# Patient Record
Sex: Female | Born: 1940 | Race: White | Hispanic: No | Marital: Married | Smoking: Never smoker
Health system: Southern US, Community
[De-identification: ages and names within clinical notes are randomized; demographics above are authoritative.]

## PROBLEM LIST (undated history)

## (undated) DIAGNOSIS — C801 Malignant (primary) neoplasm, unspecified: Secondary | ICD-10-CM

## (undated) HISTORY — DX: Malignant (primary) neoplasm, unspecified: C80.1

---

## 1978-09-27 HISTORY — PX: TUBAL LIGATION: SHX77

## 2008-11-25 DIAGNOSIS — C801 Malignant (primary) neoplasm, unspecified: Secondary | ICD-10-CM

## 2008-11-25 HISTORY — DX: Malignant (primary) neoplasm, unspecified: C80.1

## 2009-08-04 ENCOUNTER — Ambulatory Visit: Payer: Self-pay | Admitting: Family Medicine

## 2009-08-04 DIAGNOSIS — C50919 Malignant neoplasm of unspecified site of unspecified female breast: Secondary | ICD-10-CM | POA: Insufficient documentation

## 2009-08-05 ENCOUNTER — Encounter: Payer: Self-pay | Admitting: Family Medicine

## 2010-09-25 ENCOUNTER — Ambulatory Visit: Payer: Self-pay | Admitting: Family Medicine

## 2010-09-25 DIAGNOSIS — Z780 Asymptomatic menopausal state: Secondary | ICD-10-CM

## 2010-10-27 NOTE — Letter (Signed)
Summary: Records Dated 07-24-07 thru 01-01-09/[facility]  Records Dated 07-24-07 thru 01-01-09/[facility]   Imported By: Lanelle Bal 10/09/2009 12:42:33  _____________________________________________________________________  External Attachment:    Type:   Image     Comment:   External Document

## 2010-10-29 NOTE — Assessment & Plan Note (Signed)
Summary: CPE   Vital Signs:  Patient profile:   70 year old female Height:      65 inches Weight:      138 pounds BMI:     23.05 O2 Sat:      96 % on Room air Pulse rate:   66 / minute BP sitting:   104 / 57  (left arm) Cuff size:   regular  Vitals Entered By: Payton Spark CMA (September 25, 2010 2:53 PM)  O2 Flow:  Room air CC: CPE    Primary Care Provider:  Seymour Bars DO  CC:  CPE .  History of Present Illness: 70 yo postmenopausal G3P3 WF presents for CPE w/o pap.  She is s/p TAH for non cancerous reasons and denies any vag bleeding or discharge.  She was diagnosed with L breast cancer 2 yrs ago and has DECLINED ANY intervention.  She is very adamant about not proceeding with mammography, surgery, radiation, chemo, etc and understands this risk.  She eats healthy, exercises.  She is due to see the eye doctor.  Denies fam hx of premature heart dz.  Had a normal colonoscopy 3 yrs ago and declined any immunizations today.  Feels great and has no complaints today.  Is due for fasting labs and a DEXA scan.  Current Medications (verified): 1)  None  Allergies (verified): No Known Drug Allergies  Past History:  Past Medical History: Reviewed history from 08/04/2009 and no changes required. L breast cancer 11-2008 (no biopsy, no treatment) G3P3  Past Surgical History: Reviewed history from 08/04/2009 and no changes required. TAH 1980  Family History: Reviewed history from 08/04/2009 and no changes required. mother died  at 55  HTN, CHF, COPD, OA father died ETOH son ETOHism, T2DM  Social History: Finished HS.  Owns cleaning business. Married to [place].  Has 3 sons. Denies smoking or ETOH. Regularly exercises.  Review of Systems  The patient denies anorexia, fever, weight loss, weight gain, vision loss, decreased hearing, hoarseness, chest pain, syncope, dyspnea on exertion, peripheral edema, prolonged cough, headaches, hemoptysis, abdominal pain, melena,  hematochezia, severe indigestion/heartburn, hematuria, incontinence, genital sores, muscle weakness, suspicious skin lesions, transient blindness, difficulty walking, depression, unusual weight change, abnormal bleeding, enlarged lymph nodes, angioedema, breast masses, and testicular masses.    Physical Exam  General:  alert, well-developed, well-nourished, and well-hydrated.   Head:  normocephalic and atraumatic.   Eyes:  pupils equal, pupils round, and pupils reactive to light.   Ears:  EACs patent; TMs translucent and gray with good cone of light and bony landmarks.  Nose:  no nasal discharge.   Mouth:  pharynx pink and moist and fair dentition.   Neck:  no masses.  no audible carotid bruits Breasts:  No mass, nodules, thickening, tenderness, bulging, retraction, inflamation, nipple discharge or skin changes noted on the R.  There is a nickel sized mass at 11 0'clock L breast and a 2nd cystic type mass, at 9 0'clock L breast w/o nipple discharge or axillary LA.  1st mass is tender with poor movement  Lungs:  Normal respiratory effort, chest expands symmetrically. Lungs are clear to auscultation, no crackles or wheezes. Heart:  Normal rate and regular rhythm. S1 and S2 normal without gallop, murmur, click, rub or other extra sounds. Abdomen:  Bowel sounds positive,abdomen soft and non-tender without masses, organomegaly or hernias noted. Msk:  no joint tenderness, no joint swelling, no joint warmth, and no redness over joints.   Pulses:  2+ radial  and pedal pulses Extremities:  no LE edema dystrophic great toenails Skin:  color normal and no suspicious lesions.   Cervical Nodes:  No lymphadenopathy noted Psych:  good eye contact, not anxious appearing, and not depressed appearing.     Impression & Recommendations:  Problem # 1:  HEALTH MAINTENANCE EXAM (ICD-V70.0) Keeping healthy checklist for women reviewed. Update fasting labs. She has declined mammogram or treatment for breast  cancer and understands the risk including mortality associated with this. Agrees to update her DEXA scan and fasting labs. Declined any immunizations. is s/p TAH for non cancerous reasons, so does not need a pap smear. She is free to call me anytime to proceed with eval/ treatment of her left breast cancer. She is not high risk for heart dz, has excellent BP/BMI and eats healthy and exercises. MVI + Calcium with D daily. Colonoscopy due in 7 yrs.  Other Orders: T-DXA Bone Density/ Appendicular (84696) T-Dual DXA Bone Density/ Axial (29528) T-CBC w/Diff ([id number]) T-Comprehensive Metabolic Panel [id number]) T-Lipid Profile [id number]) T-Sed Rate (Automated) [id number]) T-CRP (C-Reactive Protein) [phone])   Orders Added: 1)  T-DXA Bone Density/ Appendicular [77081] 2)  T-Dual DXA Bone Density/ Axial [77080] 3)  T-CBC w/Diff [[id number]] 4)  T-Comprehensive Metabolic Panel [[id number]] 5)  T-Lipid Profile [[id number]] 6)  T-Sed Rate (Automated) [[id number]] 7)  T-CRP (C-Reactive Protein) [23860] 8)  Est. Patient age 69&> [02542]

## 2011-04-29 ENCOUNTER — Encounter: Payer: Self-pay | Admitting: Family Medicine

## 2011-04-30 ENCOUNTER — Encounter: Payer: Self-pay | Admitting: Family Medicine

## 2011-04-30 ENCOUNTER — Ambulatory Visit (INDEPENDENT_AMBULATORY_CARE_PROVIDER_SITE_OTHER): Payer: Medicare PPO | Admitting: Family Medicine

## 2011-04-30 DIAGNOSIS — L0291 Cutaneous abscess, unspecified: Secondary | ICD-10-CM

## 2011-04-30 DIAGNOSIS — L0390 Cellulitis, unspecified: Secondary | ICD-10-CM

## 2011-04-30 DIAGNOSIS — L237 Allergic contact dermatitis due to plants, except food: Secondary | ICD-10-CM

## 2011-04-30 DIAGNOSIS — L255 Unspecified contact dermatitis due to plants, except food: Secondary | ICD-10-CM

## 2011-04-30 MED ORDER — SULFAMETHOXAZOLE-TRIMETHOPRIM 800-160 MG PO TABS: 1.0000 | ORAL_TABLET | Freq: Two times a day (BID) | ORAL | Status: AC

## 2011-04-30 MED ORDER — TRIAMCINOLONE ACETONIDE 0.5 % EX OINT: TOPICAL_OINTMENT | Freq: Every day | CUTANEOUS | Status: AC

## 2011-04-30 NOTE — Progress Notes (Signed)
  Subjective:    Patient ID: Crystal Ward, female    DOB: 09-Jun-1941, 70 y.o.   MRN: [id number]  HPI About a week ago noticed an itchy spot on the right shin.  Next day really read then noticed a scab in the center. Says her husband "operated on it" and  Got some pus out of it.  Says itstil really itches.  Has been using triple antibiotic oint on it. Note she has a few scattered lesions of poison ivey on her legs and forearms.    Review of Systems     Objective:   Physical Exam  Skin:       RT shin with a 4 x 6 cm erythematous and edematous lesion with fine erytehmatous papules around the border  There is a scab in teh center. No active drianage or pus,         Assessment & Plan:  I think this is a combination of cellullitis from a wound and poison ivey - will treat with oral ABX and topical steroid cream. F.U. If not better in one week.

## 2011-04-30 NOTE — Patient Instructions (Signed)
Call if not not getting better.

## 2012-05-09 ENCOUNTER — Ambulatory Visit (INDEPENDENT_AMBULATORY_CARE_PROVIDER_SITE_OTHER): Payer: Medicare PPO | Admitting: Family Medicine

## 2012-05-09 ENCOUNTER — Encounter: Payer: Self-pay | Admitting: Family Medicine

## 2012-05-09 ENCOUNTER — Other Ambulatory Visit: Payer: Self-pay | Admitting: Family Medicine

## 2012-05-09 VITALS — BP 118/70 | HR 56 | Temp 97.5°F | Ht 65.5 in | Wt 139.0 lb

## 2012-05-09 DIAGNOSIS — N61 Mastitis without abscess: Secondary | ICD-10-CM

## 2012-05-09 DIAGNOSIS — Z23 Encounter for immunization: Secondary | ICD-10-CM

## 2012-05-09 MED ORDER — SULFAMETHOXAZOLE-TRIMETHOPRIM 800-160 MG PO TABS: 1.0000 | ORAL_TABLET | Freq: Two times a day (BID) | ORAL | Status: AC

## 2012-05-09 NOTE — Addendum Note (Signed)
Addended by: Avon Gully C on: 05/09/2012 12:03 PM   Modules accepted: Orders

## 2012-05-09 NOTE — Progress Notes (Signed)
  Subjective:    Patient ID: Crystal Ward, female    DOB: [birth year], 71 y.o.   MRN: [id number]  HPI Sore on the left breast for about 1.5 weeks. Says no trauma.  Has been using  Had a similar sore on her leg, wasn't MRSA. Was treated with triamcinolone.  It does not itch. It is tender. She has still been able to wear her bra. She denies any active drainage of the lesion.   Review of Systems     Objective:   Physical Exam Area of cellulitis on the right medial breast. Approximately 2 x 3 cm. Does have a more firm center that's approximately 1 cm. No active drainage with compression. I did cut are made a small scab in the center and just wanted to test for MRSA. I was unable to express any pus.       Assessment & Plan:  Cellulitis on the beast.- Will treat with Bactrim. Followup in 2 weeks reexamine the area. It is completely gone resolved at that point time she can cancel the appointment. We will call her with the culture results.  I encouraged her to keep the area clean and dry. Call if there's any drainage. Also recommend warm compresses. Handout given on cellulitis.

## 2012-05-09 NOTE — Addendum Note (Signed)
Addended by: Wyline Beady on: 05/09/2012 04:36 PM   Modules accepted: Orders

## 2012-05-09 NOTE — Patient Instructions (Signed)

## 2012-05-13 LAB — WOUND CULTURE

## 2012-05-26 ENCOUNTER — Ambulatory Visit: Payer: Medicare PPO | Admitting: Family Medicine

## 2014-02-13 ENCOUNTER — Telehealth: Payer: Self-pay | Admitting: Physician Assistant

## 2014-02-13 NOTE — Telephone Encounter (Signed)
Left message for patient to return call. Also stated patient needs colonoscopy, mammogram and office visit.

## 2014-02-13 NOTE — Telephone Encounter (Signed)
Call pt: make pt aware past due for screening mammogram and colonoscopy. We really need a follow up appt since 2013. We can go ahead and place these referral in the meantime.

## 2014-07-15 ENCOUNTER — Ambulatory Visit (INDEPENDENT_AMBULATORY_CARE_PROVIDER_SITE_OTHER): Payer: Medicare PPO | Admitting: Family Medicine

## 2014-07-15 ENCOUNTER — Encounter: Payer: Self-pay | Admitting: Family Medicine

## 2014-07-15 ENCOUNTER — Telehealth: Payer: Self-pay | Admitting: Family Medicine

## 2014-07-15 VITALS — BP 120/64 | HR 74 | Ht 65.5 in | Wt 148.0 lb

## 2014-07-15 DIAGNOSIS — Z23 Encounter for immunization: Secondary | ICD-10-CM

## 2014-07-15 DIAGNOSIS — Z0000 Encounter for general adult medical examination without abnormal findings: Secondary | ICD-10-CM

## 2014-07-15 DIAGNOSIS — N6320 Unspecified lump in the left breast, unspecified quadrant: Secondary | ICD-10-CM

## 2014-07-15 DIAGNOSIS — Z13220 Encounter for screening for lipoid disorders: Secondary | ICD-10-CM

## 2014-07-15 DIAGNOSIS — Z780 Asymptomatic menopausal state: Secondary | ICD-10-CM

## 2014-07-15 DIAGNOSIS — N63 Unspecified lump in breast: Secondary | ICD-10-CM

## 2014-07-15 DIAGNOSIS — R5383 Other fatigue: Secondary | ICD-10-CM

## 2014-07-15 DIAGNOSIS — R0781 Pleurodynia: Secondary | ICD-10-CM

## 2014-07-15 DIAGNOSIS — Z1211 Encounter for screening for malignant neoplasm of colon: Secondary | ICD-10-CM

## 2014-07-15 DIAGNOSIS — N6325 Unspecified lump in the left breast, overlapping quadrants: Secondary | ICD-10-CM | POA: Insufficient documentation

## 2014-07-15 MED ORDER — AMBULATORY NON FORMULARY MEDICATION: Status: DC

## 2014-07-15 NOTE — Progress Notes (Signed)
Subjective:    Crystal Ward is a 73 y.o. female who presents for Medicare Annual/Subsequent preventive examination.  Preventive Screening-Counseling & Management  Tobacco History  Smoking status  . Never Smoker   Smokeless tobacco  . Not on file     Problems Prior to Visit 1.   Current Problems (verified) Patient Active Problem List   Diagnosis Date Noted  . Breast lump on left side at 12 o'clock position 07/15/2014  . Postmenopausal status 09/25/2010  . NEOPLASM, MALIGNANT, LEFT BREAST 08/04/2009    Medications Prior to Visit No current outpatient prescriptions on file prior to visit.   No current facility-administered medications on file prior to visit.    Current Medications (verified) Current Outpatient Prescriptions  Medication Sig Dispense Refill  . AMBULATORY NON FORMULARY MEDICATION Medication Name: Shingles vaccine IM x 1  1 Units  0   No current facility-administered medications for this visit.     Allergies (verified) Review of patient's allergies indicates no known allergies.   PAST HISTORY  Family History Family History  Problem Relation Age of Onset  . Hypertension Mother   . Heart disease Mother     CHFf  . COPD Mother   . Arthritis Mother   . Alcohol abuse Father   . Alcohol abuse Son   . Diabetes Son     type 2    Social History History  Substance Use Topics  . Smoking status: Never Smoker   . Smokeless tobacco: Not on file  . Alcohol Use: No     Are there smokers in your home (other than you)? No  Risk Factors Current exercise habits: cleans houses  Dietary issues discussed: None  Cardiac risk factors: advanced age (older than 30 for men, 16 for women).  Depression Screen (Note: if answer to either of the following is "Yes", a more complete depression screening is indicated)   Over the past two weeks, have you felt down, depressed or hopeless? No  Over the past two weeks, have you felt little interest or pleasure in  doing things? No  Have you lost interest or pleasure in daily life? No  Do you often feel hopeless? No  Do you cry easily over simple problems? No  Activities of Daily Living In your present state of health, do you have any difficulty performing the following activities?:  Driving? No Managing money?  No Feeding yourself? No Getting from bed to chair? No  Climbing a flight of stairs? No Preparing food and eating?: No Bathing or showering? No Getting dressed: No Getting to the toilet? No Using the toilet:No Moving around from place to place: No In the past year have you fallen or had a near fall?:No   Are you sexually active?  No  Do you have more than one partner?  No  Hearing Difficulties: No Do you often ask people to speak up or repeat themselves? Yes Do you experience ringing or noises in your ears? No Do you have difficulty understanding soft or whispered voices? Yes   Do you feel that you have a problem with memory? No  Do you often misplace items? No  Do you feel safe at home?  No  Cognitive Testing  Alert? Yes  Normal Appearance?Yes  Oriented to person? Yes  Place? Yes   Time? Yes  Recall of three objects?  Yes  Can perform simple calculations? Yes  Displays appropriate judgment?Yes  Can read the correct time from a watch face?Yes   6 CIT  score of 2/28 ( normal)    Advanced Directives have been discussed with the patient? Yes  List the Names of Other Physician/Practitioners you currently use: 1.    Indicate any recent Medical Services you may have received from other than [facility] providers in the past year (date may be approximate).  Immunization History  Administered Date(s) Administered  . Pneumococcal Conjugate-13 07/15/2014  . Tdap 05/09/2012    Screening Tests Health Maintenance  Topic Date Due  . Zostavax  11/04/2001  . Pneumococcal Polysaccharide Vaccine Age 83 And Over  11/04/2006  . Influenza Vaccine  07/16/2015 (Originally 04/27/2014)  .  Mammogram  07/16/2019 (Originally 11/05/1991)  . Colonoscopy  07/16/2019 (Originally 11/05/1991)  . Tetanus/tdap  05/09/2022    All answers were reviewed with the patient and necessary referrals were made:  METHENEY,CATHERINE, MD   07/15/2014   History reviewed: allergies, current medications, past family history, past medical history, past social history, past surgical history and problem list  Review of Systems A comprehensive review of systems was negative.    Objective:     Vision by Snellen chart: right eye:20/40, left eye:20/50  Body mass index is 24.25 kg/(m^2). BP 120/64  Pulse 74  Ht 5' 5.5" (1.664 m)  Wt 148 lb (67.132 kg)  BMI 24.25 kg/m2  BP 120/64  Pulse 74  Ht 5' 5.5" (1.664 m)  Wt 148 lb (67.132 kg)  BMI 24.25 kg/m2 General appearance: alert, cooperative and appears stated age Head: Normocephalic, without obvious abnormality, atraumatic Eyes: conj clear EOMI, PEERLA Ears: normal TM's and external ear canals both ears Nose: Nares normal. Septum midline. Mucosa normal. No drainage or sinus tenderness. Throat: lips, mucosa, and tongue normal; teeth and gums normal Neck: no adenopathy, no carotid bruit, no JVD, supple, symmetrical, trachea midline and thyroid not enlarged, symmetric, no tenderness/mass/nodules Back: symmetric, no curvature. ROM normal. No CVA tenderness. Lungs: clear to auscultation bilaterally Breasts: right breast with normal appearance and no palpable lesions. Left breast with normal appearance.Or surface skin changes. No erythema. She does have an approximately 2 x 3 cm oval smooth lesion at the 12:00 position about 1 cm from the edge of the area a lot. Heart: regular rate and rhythm, S1, S2 normal, no murmur, click, rub or gallop Abdomen: soft, non-tender; bowel sounds normal; no masses,  no organomegaly Extremities: extremities normal, atraumatic, no cyanosis or edema Pulses: 2+ and symmetric Skin: Skin color, texture, turgor normal. No  rashes or lesions Lymph nodes: Cervical, supraclavicular, and axillary nodes normal. Neurologic: Alert and oriented X 3, normal strength and tone. Normal symmetric reflexes. Normal coordination and gait     Assessment:     Medicare annual wellness exam.      Plan:     During the course of the visit the patient was educated and counseled about appropriate screening and preventive services including:    Pneumococcal vaccine   - Prevnar 13 givne today  Flu shot declined.  Recommend vision exam w/ optometry  Screenin colonoscopy declined.  Stool cards given today  Rx given for shingles vaccine  Breast lump - patient this has been present for 6 or 7 years and had a workup then. She has refused biopsy and all treatment since then. She still declines to have any type of workup her mammogram or biopsy. Reassuring factors that has not changed in size in that time frame.  She also specifically request to have some mineral deficiency tests done. She is a firm believer and supplements to help  reduce her chance of cancer. She has felt a little bit more tired. She does take a B12 supplement. He is willing to pay out of pocket for this latter not covered with her insurance to  Diet review for nutrition referral? Yes ____  Not Indicated ____   Patient Instructions (the written plan) was given to the patient.  Medicare Attestation I have personally reviewed: The patient's medical and social history Their use of alcohol, tobacco or illicit drugs Their current medications and supplements The patient's functional ability including ADLs,fall risks, home safety risks, cognitive, and hearing and visual impairment Diet and physical activities Evidence for depression or mood disorders  The patient's weight, height, BMI, and visual acuity have been recorded in the chart.  I have made referrals, counseling, and provided education to the patient based on review of the above and I have provided the  patient with a written personalized care plan for preventive services.     METHENEY,CATHERINE, MD   07/15/2014

## 2014-07-15 NOTE — Telephone Encounter (Signed)
Sorry can they send [place] a copy of her last one.

## 2014-07-15 NOTE — Telephone Encounter (Signed)
Larene Beach from [facility] called because they received a referral for colonoscopy, but they state that patient is not due until 2018

## 2014-07-16 LAB — COMPLETE METABOLIC PANEL WITH GFR
ALBUMIN: 4.3 g/dL (ref 3.5–5.2)
ALK PHOS: 46 U/L (ref 39–117)
ALT: 17 U/L (ref 0–35)
AST: 20 U/L (ref 0–37)
BILIRUBIN TOTAL: 0.5 mg/dL (ref 0.2–1.2)
BUN: 17 mg/dL (ref 6–23)
CO2: 27 mEq/L (ref 19–32)
Calcium: 9.5 mg/dL (ref 8.4–10.5)
Chloride: 106 mEq/L (ref 96–112)
Creat: 0.84 mg/dL (ref 0.50–1.10)
GFR, EST NON AFRICAN AMERICAN: 70 mL/min
GFR, Est African American: 80 mL/min
Glucose, Bld: 97 mg/dL (ref 70–99)
POTASSIUM: 4.6 meq/L (ref 3.5–5.3)
Sodium: 140 mEq/L (ref 135–145)
TOTAL PROTEIN: 6.3 g/dL (ref 6.0–8.3)

## 2014-07-16 LAB — CBC
HEMATOCRIT: 38.9 % (ref 36.0–46.0)
Hemoglobin: 13.2 g/dL (ref 12.0–15.0)
MCH: 29.7 pg (ref 26.0–34.0)
MCHC: 33.9 g/dL (ref 30.0–36.0)
MCV: 87.6 fL (ref 78.0–100.0)
Platelets: 195 10*3/uL (ref 150–400)
RBC: 4.44 MIL/uL (ref 3.87–5.11)
RDW: 14.2 % (ref 11.5–15.5)
WBC: 4.6 10*3/uL (ref 4.0–10.5)

## 2014-07-16 LAB — FOLATE: Folate: >20 ng/mL

## 2014-07-16 LAB — LIPID PANEL
CHOL/HDL RATIO: 3 ratio
Cholesterol: 172 mg/dL (ref 0–200)
HDL: 58 mg/dL (ref >39)
LDL Cholesterol: 69 mg/dL (ref 0–99)
Triglycerides: 224 mg/dL — ABNORMAL HIGH (ref <150)
VLDL: 45 mg/dL — ABNORMAL HIGH (ref 0–40)

## 2014-07-16 LAB — TSH: TSH: 1.551 u[IU]/mL (ref 0.350–4.500)

## 2014-07-16 LAB — MAGNESIUM: MAGNESIUM: 1.7 mg/dL (ref 1.5–2.5)

## 2014-07-16 LAB — T3, FREE: T3, Free: 2.8 pg/mL (ref 2.3–4.2)

## 2014-07-16 LAB — T4, FREE: Free T4: 0.85 ng/dL (ref 0.80–1.80)

## 2014-07-16 LAB — VITAMIN B12: Vitamin B-12: 1122 pg/mL — ABNORMAL HIGH (ref 211–911)

## 2014-07-16 LAB — FERRITIN: FERRITIN: 76 ng/mL (ref 10–291)

## 2014-07-18 ENCOUNTER — Ambulatory Visit (INDEPENDENT_AMBULATORY_CARE_PROVIDER_SITE_OTHER): Payer: Medicare PPO

## 2014-07-18 ENCOUNTER — Other Ambulatory Visit: Payer: Self-pay | Admitting: Family Medicine

## 2014-07-18 DIAGNOSIS — R0781 Pleurodynia: Secondary | ICD-10-CM

## 2014-07-18 DIAGNOSIS — Z780 Asymptomatic menopausal state: Secondary | ICD-10-CM

## 2014-07-18 DIAGNOSIS — N6325 Unspecified lump in the left breast, overlapping quadrants: Secondary | ICD-10-CM

## 2014-07-18 DIAGNOSIS — N6320 Unspecified lump in the left breast, unspecified quadrant: Secondary | ICD-10-CM

## 2014-07-18 DIAGNOSIS — M859 Disorder of bone density and structure, unspecified: Secondary | ICD-10-CM

## 2014-07-18 DIAGNOSIS — R5383 Other fatigue: Secondary | ICD-10-CM

## 2014-07-18 LAB — VITAMIN B1: VITAMIN B1 (THIAMINE): 27 nmol/L (ref 8–30)

## 2014-07-18 LAB — VITAMIN B6: VITAMIN B6: 78.8 ng/mL — AB (ref 2.1–21.7)

## 2014-07-19 ENCOUNTER — Encounter: Payer: Self-pay | Admitting: Family Medicine

## 2014-07-19 DIAGNOSIS — M8580 Other specified disorders of bone density and structure, unspecified site: Secondary | ICD-10-CM | POA: Insufficient documentation

## 2014-07-24 ENCOUNTER — Encounter: Payer: Self-pay | Admitting: *Deleted

## 2015-03-17 ENCOUNTER — Ambulatory Visit: Payer: Medicare PPO | Admitting: Family Medicine

## 2015-03-17 ENCOUNTER — Ambulatory Visit: Payer: Self-pay | Admitting: Family Medicine

## 2015-03-21 ENCOUNTER — Ambulatory Visit (INDEPENDENT_AMBULATORY_CARE_PROVIDER_SITE_OTHER): Payer: Medicare PPO | Admitting: Family Medicine

## 2015-03-21 ENCOUNTER — Encounter: Payer: Self-pay | Admitting: Family Medicine

## 2015-03-21 VITALS — BP 100/55 | HR 68 | Ht 65.5 in | Wt 149.0 lb

## 2015-03-21 DIAGNOSIS — E049 Nontoxic goiter, unspecified: Secondary | ICD-10-CM | POA: Diagnosis not present

## 2015-03-21 DIAGNOSIS — R51 Headache: Secondary | ICD-10-CM

## 2015-03-21 DIAGNOSIS — R519 Headache, unspecified: Secondary | ICD-10-CM

## 2015-03-21 DIAGNOSIS — Z79899 Other long term (current) drug therapy: Secondary | ICD-10-CM | POA: Diagnosis not present

## 2015-03-21 LAB — CBC WITH DIFFERENTIAL/PLATELET
Basophils Absolute: 0.1 10*3/uL (ref 0.0–0.1)
Basophils Relative: 1 % (ref 0–1)
Eosinophils Absolute: 0.8 10*3/uL — ABNORMAL HIGH (ref 0.0–0.7)
Eosinophils Relative: 13 % — ABNORMAL HIGH (ref 0–5)
HEMATOCRIT: 37.7 % (ref 36.0–46.0)
HEMOGLOBIN: 12.4 g/dL (ref 12.0–15.0)
LYMPHS ABS: 1.2 10*3/uL (ref 0.7–4.0)
LYMPHS PCT: 20 % (ref 12–46)
MCH: 28.9 pg (ref 26.0–34.0)
MCHC: 32.9 g/dL (ref 30.0–36.0)
MCV: 87.9 fL (ref 78.0–100.0)
MPV: 10.3 fL (ref 8.6–12.4)
Monocytes Absolute: 0.5 10*3/uL (ref 0.1–1.0)
Monocytes Relative: 8 % (ref 3–12)
NEUTROS ABS: 3.5 10*3/uL (ref 1.7–7.7)
Neutrophils Relative %: 58 % (ref 43–77)
Platelets: 200 10*3/uL (ref 150–400)
RBC: 4.29 MIL/uL (ref 3.87–5.11)
RDW: 14.3 % (ref 11.5–15.5)
WBC: 6.1 10*3/uL (ref 4.0–10.5)

## 2015-03-21 NOTE — Progress Notes (Addendum)
Subjective:    Patient ID: Crystal Ward, female    DOB: 1940-11-22, 74 y.o.   MRN: [id number]  HPI 7 days ago started with a pounding servere HA.  Took some ASA and some supplements and then got better on Saturday.  That night at 3AM had a pounding HA that woke up and then vomited 3 times.  Took 800mg  IBU.  Lasted all day Sunday.  Got relief with Goodies powder. Then only had a mild pressure. Not sure if sinuses or allergy.  HA was frontal and then radiated back. Now has a light pressure on the top or right side of head.  Says this HA was 10/10.    Before this has been waking up with the right eye matted and crusty. Watering a lot and crusting some That has been better this week.  No neurologicl changes. No vision changes.  No chane in balance issues. No hx of migraines.    She has noticed her shoulder and neck have been more "tense" lately   Review of Systems  No nausea or diarrhea. No weakness numbness or paresthesias of the extremities. No speech changes. No facial droop. No hearing change.  BP 100/55 mmHg  Pulse 68  Ht 5' 5.5" (1.664 m)  Wt 149 lb (67.586 kg)  BMI 24.41 kg/m2    No Known Allergies  Past Medical History  Diagnosis Date  . Cancer 11-2008    LT breast/no biopsy/no treatment    Past Surgical History  Procedure Laterality Date  . Tubal ligation  1980    History   Social History  . Marital Status: Married    Spouse Name: N/A  . Number of Children: N/A  . Years of Education: N/A   Occupational History  . Not on file.   Social History Main Topics  . Smoking status: Never Smoker   . Smokeless tobacco: Not on file  . Alcohol Use: No  . Drug Use: Not on file  . Sexual Activity: Not on file     Comment: Owns cleaning business, 3 sons, regularly exercises, finished HS.   Other Topics Concern  . Not on file   Social History Narrative    Family History  Problem Relation Age of Onset  . Hypertension Mother   . Heart disease Mother     CHFf  .  COPD Mother   . Arthritis Mother   . Alcohol abuse Father   . Alcohol abuse Son   . Diabetes Son     type 2    Outpatient Encounter Prescriptions as of 03/21/2015  Medication Sig  . [DISCONTINUED] AMBULATORY NON FORMULARY MEDICATION Medication Name: Shingles vaccine IM x 1   No facility-administered encounter medications on file as of 03/21/2015.          Objective:   Physical Exam  Constitutional: She is oriented to person, place, and time. She appears well-developed and well-nourished.  HENT:  Head: Normocephalic and atraumatic.  Right Ear: External ear normal.  Left Ear: External ear normal.  Nose: Nose normal.  Mouth/Throat: Oropharynx is clear and moist. No oropharyngeal exudate.  TMs and canals are clear.   Eyes: Conjunctivae and EOM are normal. Pupils are equal, round, and reactive to light. Right eye exhibits no discharge. Left eye exhibits no discharge. No scleral icterus.  Neck: Neck supple. Thyromegaly present.  Enlarge thyroid, slightly larger on the left.   Cardiovascular: Normal rate, regular rhythm and normal heart sounds.   Pulmonary/Chest: Effort normal and breath sounds  normal. She has no wheezes.  Abdominal: Soft. Bowel sounds are normal. She exhibits no distension and no mass. There is no tenderness. There is no rebound and no guarding.  Musculoskeletal: She exhibits no edema.  Some pain over the cervical spine with extension. Some pain with full rotation right and left.  Tender over both upper trapezius muscles.   Lymphadenopathy:    She has no cervical adenopathy.  Neurological: She is alert and oriented to person, place, and time. She has normal reflexes. She displays normal reflexes. No cranial nerve deficit. She exhibits normal muscle tone. Coordination normal.  Skin: Skin is warm and dry.  Psychiatric: She has a normal mood and affect. Her behavior is normal. Judgment and thought content normal.          Assessment & Plan:  Severe headache-this  is concerning to me in a 74 year old with no prior history of migraines etc. There are no neurologic findings on exam. But I would still recommend moving forward with CT of the head. I would also like to check some blood work including a CBC, thyroid level electrolytes etc. After much discussion she wanted to do the blood work today and if everything comes back normal and she has another headache then she is okay with getting the head CT. We did discuss that there is some radiation exposure with CT. Next  Enlarged thyroid gland noted on exam today. I had not previously noticed this. Recommend TSH and ultrasound for further evaluation.  Right eye conjunctivitis-patient is now symptom free and has no findings on exam today.

## 2015-03-22 LAB — COMPLETE METABOLIC PANEL WITH GFR
ALT: 16 U/L (ref 0–35)
AST: 19 U/L (ref 0–37)
Albumin: 4.2 g/dL (ref 3.5–5.2)
Alkaline Phosphatase: 49 U/L (ref 39–117)
BILIRUBIN TOTAL: 0.4 mg/dL (ref 0.2–1.2)
BUN: 26 mg/dL — ABNORMAL HIGH (ref 6–23)
CO2: 30 mEq/L (ref 19–32)
CREATININE: 1.05 mg/dL (ref 0.50–1.10)
Calcium: 9.8 mg/dL (ref 8.4–10.5)
Chloride: 103 mEq/L (ref 96–112)
GFR, EST NON AFRICAN AMERICAN: 53 mL/min — AB
GFR, Est African American: 61 mL/min
GLUCOSE: 92 mg/dL (ref 70–99)
Potassium: 4.3 mEq/L (ref 3.5–5.3)
Sodium: 139 mEq/L (ref 135–145)
Total Protein: 6.6 g/dL (ref 6.0–8.3)

## 2015-03-22 LAB — C-REACTIVE PROTEIN: CRP: <0.5 mg/dL (ref <0.60)

## 2015-03-22 LAB — SEDIMENTATION RATE: SED RATE: 1 mm/h (ref 0–30)

## 2015-03-22 LAB — TSH: TSH: 1.368 u[IU]/mL (ref 0.350–4.500)

## 2015-03-22 LAB — T4, FREE: Free T4: 1.1 ng/dL (ref 0.80–1.80)

## 2015-03-25 ENCOUNTER — Other Ambulatory Visit: Payer: Self-pay | Admitting: *Deleted

## 2015-03-25 DIAGNOSIS — R519 Headache, unspecified: Secondary | ICD-10-CM

## 2015-03-25 DIAGNOSIS — R51 Headache: Principal | ICD-10-CM

## 2015-07-14 ENCOUNTER — Encounter: Payer: Self-pay | Admitting: Family Medicine

## 2015-07-14 ENCOUNTER — Ambulatory Visit (INDEPENDENT_AMBULATORY_CARE_PROVIDER_SITE_OTHER): Payer: Medicare PPO | Admitting: Family Medicine

## 2015-07-14 VITALS — BP 126/68 | HR 78 | Temp 98.2°F | Resp 18 | Wt 156.6 lb

## 2015-07-14 DIAGNOSIS — J00 Acute nasopharyngitis [common cold]: Secondary | ICD-10-CM

## 2015-07-14 DIAGNOSIS — J209 Acute bronchitis, unspecified: Secondary | ICD-10-CM

## 2015-07-14 MED ORDER — AMOXICILLIN 875 MG PO TABS: 875.0000 mg | ORAL_TABLET | Freq: Two times a day (BID) | ORAL | Status: DC

## 2015-07-14 NOTE — Progress Notes (Signed)
   Subjective:    Patient ID: Crystal Ward, female    DOB: 1941-08-19, 74 y.o.   MRN: [id number]  HPI Cough Pt states has had a cough for 3 wks that's getting worse. Is productive with clear sputum and states the cough is "deep".  No fever, chills.  Lots of post nasal drip. Lots of mucous. No sinus pressure. Taking IBU to sleep at night.    Review of Systems     Objective:   Physical Exam  Constitutional: She is oriented to person, place, and time. She appears well-developed and well-nourished.  HENT:  Head: Normocephalic and atraumatic.  Right Ear: External ear normal.  Left Ear: External ear normal.  Nose: Nose normal.  Mouth/Throat: Oropharynx is clear and moist.  TMs and canals are clear.   Eyes: Conjunctivae and EOM are normal. Pupils are equal, round, and reactive to light.  Neck: Neck supple. No thyromegaly present.  Cardiovascular: Normal rate, regular rhythm and normal heart sounds.   Pulmonary/Chest: Effort normal and breath sounds normal. She has no wheezes.  Lymphadenopathy:    She has no cervical adenopathy.  Neurological: She is alert and oriented to person, place, and time.  Skin: Skin is warm and dry.  Psychiatric: She has a normal mood and affect.          Assessment & Plan:  Acute tracheobronchitis - likely viral. To go ahead and give her perception for an antibiotic to fill if she suddenly gets worse or is not improving by the end of the week. Continue Naprosyn at bedtime since it does seem to help control her cough. Make sure hydrating well. Encouraged her to stay home and rest from work.

## 2015-07-24 ENCOUNTER — Ambulatory Visit (INDEPENDENT_AMBULATORY_CARE_PROVIDER_SITE_OTHER): Payer: Commercial Managed Care - HMO

## 2015-07-24 ENCOUNTER — Ambulatory Visit (INDEPENDENT_AMBULATORY_CARE_PROVIDER_SITE_OTHER): Payer: Commercial Managed Care - HMO | Admitting: Family Medicine

## 2015-07-24 ENCOUNTER — Encounter: Payer: Self-pay | Admitting: Family Medicine

## 2015-07-24 VITALS — BP 125/64 | HR 68 | Temp 98.2°F | Resp 18 | Wt 155.9 lb

## 2015-07-24 DIAGNOSIS — R0989 Other specified symptoms and signs involving the circulatory and respiratory systems: Secondary | ICD-10-CM | POA: Diagnosis not present

## 2015-07-24 DIAGNOSIS — R0981 Nasal congestion: Secondary | ICD-10-CM

## 2015-07-24 DIAGNOSIS — R053 Chronic cough: Secondary | ICD-10-CM

## 2015-07-24 DIAGNOSIS — R05 Cough: Secondary | ICD-10-CM

## 2015-07-24 DIAGNOSIS — R062 Wheezing: Secondary | ICD-10-CM

## 2015-07-24 NOTE — Progress Notes (Signed)
   Subjective:    Patient ID: Crystal Ward, female    DOB: 06-Jul-1941, 74 y.o.   MRN: [id number]  HPI Nasal congestion and cough x 5 days.  No facial pain or perssure.  No fever, chills.  Has been wheezing esp at night.  Ear have been popping.  Says she took the full course of amoxicillin and she is not better. No known exposure to pertussis or TB.  She says her cough is actually better at night when she lays on her right side. It seems to settle down and she is actually sleeping well. The cough gradually gets worse during the day.   Review of Systems     Objective:   Physical Exam  Constitutional: She is oriented to person, place, and time. She appears well-developed and well-nourished.  HENT:  Head: Normocephalic and atraumatic.  Right Ear: External ear normal.  Left Ear: External ear normal.  Nose: Nose normal.  Mouth/Throat: Oropharynx is clear and moist.  TMs and canals are clear.   Eyes: Conjunctivae and EOM are normal. Pupils are equal, round, and reactive to light.  Neck: Neck supple. No thyromegaly present.  Cardiovascular: Normal rate, regular rhythm and normal heart sounds.   Pulmonary/Chest: Effort normal and breath sounds normal. She has no wheezes.  Slight inspiratory wheeze bilaterally  Lymphadenopathy:    She has no cervical adenopathy.  Neurological: She is alert and oriented to person, place, and time.  Skin: Skin is warm and dry.  Psychiatric: She has a normal mood and affect.          Assessment & Plan:  Cough with sinus congestion - I am concerned because she feels like she is actually getting worse. We'll go ahead and get a chest x-ray today she does have some persistent wheezing pulse ox is reassuring. Consider pneumonia versus bronchitis versus chest mass. No crackles or other signs of volume overload to suggest fluid. CXR is normal. Will tx with azithromycing.

## 2015-07-25 LAB — CBC WITH DIFFERENTIAL/PLATELET
BASOS ABS: 0.1 10*3/uL (ref 0.0–0.1)
Basophils Relative: 1 % (ref 0–1)
EOS ABS: 0.2 10*3/uL (ref 0.0–0.7)
EOS PCT: 3 % (ref 0–5)
HEMATOCRIT: 41.1 % (ref 36.0–46.0)
Hemoglobin: 13.7 g/dL (ref 12.0–15.0)
LYMPHS ABS: 1.3 10*3/uL (ref 0.7–4.0)
LYMPHS PCT: 21 % (ref 12–46)
MCH: 29.5 pg (ref 26.0–34.0)
MCHC: 33.3 g/dL (ref 30.0–36.0)
MCV: 88.6 fL (ref 78.0–100.0)
MONO ABS: 0.6 10*3/uL (ref 0.1–1.0)
MPV: 11.3 fL (ref 8.6–12.4)
Monocytes Relative: 10 % (ref 3–12)
Neutro Abs: 4.1 10*3/uL (ref 1.7–7.7)
Neutrophils Relative %: 65 % (ref 43–77)
PLATELETS: 208 10*3/uL (ref 150–400)
RBC: 4.64 MIL/uL (ref 3.87–5.11)
RDW: 13.2 % (ref 11.5–15.5)
WBC: 6.3 10*3/uL (ref 4.0–10.5)

## 2015-07-25 NOTE — Progress Notes (Signed)
Quick Note:  All labs are normal. ______ 

## 2015-07-29 ENCOUNTER — Telehealth: Payer: Self-pay | Admitting: Family Medicine

## 2015-07-29 LAB — BORDETELLA PERTUSSIS PCR

## 2015-07-29 NOTE — Telephone Encounter (Signed)
FYIUbaldo Glassing from [facility] called, they are unable to preform the Bordetella Pertussis PCR test because it has to be a sterile culture. They are able to process the Culture, Bordetella and those results will be uploaded when completed.

## 2015-08-08 LAB — CULTURE, BORDETELLA PERTUSSIS

## 2016-04-02 ENCOUNTER — Encounter: Payer: Self-pay | Admitting: Family Medicine

## 2016-04-02 ENCOUNTER — Ambulatory Visit (INDEPENDENT_AMBULATORY_CARE_PROVIDER_SITE_OTHER): Payer: Commercial Managed Care - HMO | Admitting: Family Medicine

## 2016-04-02 VITALS — BP 134/68 | HR 70 | Wt 154.0 lb

## 2016-04-02 DIAGNOSIS — M8580 Other specified disorders of bone density and structure, unspecified site: Secondary | ICD-10-CM

## 2016-04-02 DIAGNOSIS — N183 Chronic kidney disease, stage 3 unspecified: Secondary | ICD-10-CM

## 2016-04-02 DIAGNOSIS — E559 Vitamin D deficiency, unspecified: Secondary | ICD-10-CM | POA: Diagnosis not present

## 2016-04-02 DIAGNOSIS — R0989 Other specified symptoms and signs involving the circulatory and respiratory systems: Secondary | ICD-10-CM | POA: Diagnosis not present

## 2016-04-02 DIAGNOSIS — E538 Deficiency of other specified B group vitamins: Secondary | ICD-10-CM | POA: Diagnosis not present

## 2016-04-02 DIAGNOSIS — D649 Anemia, unspecified: Secondary | ICD-10-CM | POA: Diagnosis not present

## 2016-04-02 LAB — CBC WITH DIFFERENTIAL/PLATELET
BASOS PCT: 1 %
Basophils Absolute: 58 cells/uL (ref 0–200)
EOS PCT: 2 %
Eosinophils Absolute: 116 cells/uL (ref 15–500)
HCT: 41.3 % (ref 35.0–45.0)
HEMOGLOBIN: 13.2 g/dL (ref 11.7–15.5)
LYMPHS ABS: 1276 {cells}/uL (ref 850–3900)
Lymphocytes Relative: 22 %
MCH: 28.6 pg (ref 27.0–33.0)
MCHC: 32 g/dL (ref 32.0–36.0)
MCV: 89.4 fL (ref 80.0–100.0)
MONOS PCT: 8 %
MPV: 10.2 fL (ref 7.5–12.5)
Monocytes Absolute: 464 cells/uL (ref 200–950)
NEUTROS ABS: 3886 {cells}/uL (ref 1500–7800)
Neutrophils Relative %: 67 %
PLATELETS: 203 10*3/uL (ref 140–400)
RBC: 4.62 MIL/uL (ref 3.80–5.10)
RDW: 14.3 % (ref 11.0–15.0)
WBC: 5.8 10*3/uL (ref 3.8–10.8)

## 2016-04-02 LAB — FERRITIN: FERRITIN: 63 ng/mL (ref 20–288)

## 2016-04-02 LAB — VITAMIN B12: VITAMIN B 12: 1392 pg/mL — AB (ref 200–1100)

## 2016-04-02 NOTE — Progress Notes (Signed)
Subjective:    CC: noticed her pulse on the pulse ox going up and down.   HPI: 75 year old female with no current medical problems, prior history of left breast cancer with no significant treatment except for biopsy, comes in today complaining of starting to use her husband's pulse ox. Says noticed the pulse going up and down. She will occ feel like a "catch" in her breathing.  She says it just lasts for a second. No lightheadedness or dizziness when it happens. She says it's not really a fluttering. Some is like a catching her breath but she can tell it's from her heart. She's not had any chest pain or discomfort. 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 He's been noticing this for the last year. No new medications etc. She is partly concerned because her husband was diagnosed with heart failure about 2 years ago. She has not noticed any extremity swelling.  Past medical history, Surgical history, Family history not pertinant except as noted below, Social history, Allergies, and medications have been entered into the medical record, reviewed, and corrections made.   Review of Systems: No fevers, chills, night sweats, weight loss, chest pain, or shortness of breath.   Objective:    General: Well Developed, well nourished, and in no acute distress.  Neuro: Alert and oriented x3, extra-ocular muscles intact, sensation grossly intact.  HEENT: Normocephalic, atraumatic  Skin: Warm and dry, no rashes. Cardiac: Regular rate and rhythm, no murmurs rubs or gallops, no lower extremity edema.  Respiratory: Clear to auscultation bilaterally. Not using accessory muscles, speaking in full sentences.   Impression and Recommendations:   Abnormal pulse-we'll do EKG today. We'll do some lab work to rule out thyroid disorder and check for anemia etc. Will check for electrolyte disturbance. If the symptoms become more frequent or she starts to develop chest pain and please call back immediately  and we can work her up further with a stress test etc. They've her reassurance. Today her pulse is nice and regular. Blood pressure at goal.  Osteopenia-due to recheck vitamin D level.   EKG today shows rate of 62 bpm, normal sinus rhythm with normal axis. No ST-T wave changes.

## 2016-04-03 LAB — COMPLETE METABOLIC PANEL WITH GFR
ALT: 19 U/L (ref 6–29)
AST: 23 U/L (ref 10–35)
Albumin: 4.4 g/dL (ref 3.6–5.1)
Alkaline Phosphatase: 59 U/L (ref 33–130)
BILIRUBIN TOTAL: 0.5 mg/dL (ref 0.2–1.2)
BUN: 20 mg/dL (ref 7–25)
CO2: 25 mmol/L (ref 20–31)
CREATININE: 0.98 mg/dL — AB (ref 0.60–0.93)
Calcium: 10.2 mg/dL (ref 8.6–10.4)
Chloride: 102 mmol/L (ref 98–110)
GFR, EST NON AFRICAN AMERICAN: 57 mL/min — AB (ref >=60)
GFR, Est African American: 66 mL/min (ref >=60)
GLUCOSE: 91 mg/dL (ref 65–99)
Potassium: 4.3 mmol/L (ref 3.5–5.3)
SODIUM: 139 mmol/L (ref 135–146)
TOTAL PROTEIN: 6.8 g/dL (ref 6.1–8.1)

## 2016-04-03 LAB — VITAMIN D 25 HYDROXY (VIT D DEFICIENCY, FRACTURES): VIT D 25 HYDROXY: 73 ng/mL (ref 30–100)

## 2016-04-03 LAB — TSH: TSH: 1.6 mIU/L

## 2016-04-05 ENCOUNTER — Encounter: Payer: Self-pay | Admitting: Family Medicine

## 2016-04-05 DIAGNOSIS — N183 Chronic kidney disease, stage 3 unspecified: Secondary | ICD-10-CM | POA: Insufficient documentation

## 2016-04-05 NOTE — Addendum Note (Signed)
Addended by: Teddy Spike on: 04/05/2016 12:27 PM   Modules accepted: Orders

## 2016-04-08 ENCOUNTER — Other Ambulatory Visit: Payer: Commercial Managed Care - HMO

## 2016-04-09 ENCOUNTER — Ambulatory Visit (INDEPENDENT_AMBULATORY_CARE_PROVIDER_SITE_OTHER): Payer: Commercial Managed Care - HMO

## 2016-04-09 DIAGNOSIS — N183 Chronic kidney disease, stage 3 unspecified: Secondary | ICD-10-CM

## 2016-04-09 DIAGNOSIS — R944 Abnormal results of kidney function studies: Secondary | ICD-10-CM | POA: Diagnosis not present

## 2016-04-14 ENCOUNTER — Ambulatory Visit (INDEPENDENT_AMBULATORY_CARE_PROVIDER_SITE_OTHER): Payer: Commercial Managed Care - HMO

## 2016-04-14 ENCOUNTER — Encounter: Payer: Self-pay | Admitting: Family Medicine

## 2016-04-14 ENCOUNTER — Ambulatory Visit (INDEPENDENT_AMBULATORY_CARE_PROVIDER_SITE_OTHER): Payer: Commercial Managed Care - HMO | Admitting: Family Medicine

## 2016-04-14 VITALS — BP 124/76 | HR 67 | Wt 155.0 lb

## 2016-04-14 DIAGNOSIS — M160 Bilateral primary osteoarthritis of hip: Secondary | ICD-10-CM | POA: Diagnosis not present

## 2016-04-14 DIAGNOSIS — M25551 Pain in right hip: Secondary | ICD-10-CM

## 2016-04-14 NOTE — Progress Notes (Signed)
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 No fracture seen. ______

## 2016-04-14 NOTE — Patient Instructions (Addendum)
Thank you for coming in today. Do the home exercises we discussed.  30 reps 2x daily  Side leg raises Set ups Stretching.  Return in 4-6 weeks or sooner if needed.   Hip Bursitis Bursitis is a swelling and soreness (inflammation) of a fluid-filled sac (bursa). This sac overlies and protects the joints.  CAUSES   Injury.  Overuse of the muscles surrounding the joint.  Arthritis.  Gout.  Infection.  Cold weather.  Inadequate warm-up and conditioning prior to activities. The cause may not be known.  SYMPTOMS   Mild to severe irritation.  Tenderness and swelling over the outside of the hip.  Pain with motion of the hip.  If the bursa becomes infected, a fever may be present. Redness, tenderness, and warmth will develop over the hip. Symptoms usually lessen in 3 to 4 weeks with treatment, but can come back. TREATMENT If conservative treatment does not work, your caregiver may advise draining the bursa and injecting cortisone into the area. This may speed up the healing process. This may also be used as an initial treatment of choice. HOME CARE INSTRUCTIONS   Apply ice to the affected area for 15-20 minutes every 3 to 4 hours while awake for the first 2 days. Put the ice in a plastic bag and place a towel between the bag of ice and your skin.  Rest the painful joint as much as possible, but continue to put the joint through a normal range of motion at least 4 times per day. When the pain lessens, begin normal, slow movements and usual activities to help prevent stiffness of the hip.  Only take over-the-counter or prescription medicines for pain, discomfort, or fever as directed by your caregiver.  Use crutches to limit weight bearing on the hip joint, if advised.  Elevate your painful hip to reduce swelling. Use pillows for propping and cushioning your legs and hips.  Gentle massage may provide comfort and decrease swelling. SEEK IMMEDIATE MEDICAL CARE IF:   Your pain  increases even during treatment, or you are not improving.  You have a fever.  You have heat and inflammation over the involved bursa.  You have any other questions or concerns. MAKE SURE YOU:   Understand these instructions.  Will watch your condition.  Will get help right away if you are not doing well or get worse.   This information is not intended to replace advice given to you by your health care provider. Make sure you discuss any questions you have with your health care provider.   Document Released: 03/05/2002 Document Revised: 12/06/2011 Document Reviewed: 04/15/2015 Elsevier Interactive Patient Education [employer].

## 2016-04-14 NOTE — Progress Notes (Signed)
   Crystal Ward is a 75 y.o. female who presents to [facility] today for right hip pain.  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 The pain radiates to the lateral thigh but not beyond the knee. Pain can be very severe at times but generally is pretty brief in duration. She has not tried any medications yet. She denies any injury fevers chills nausea vomiting or diarrhea.  She takes several vitamins and supplements for various aches and pains which seem to help.   Past Medical History  Diagnosis Date  . Cancer ([place]) 11-2008    LT breast/no biopsy/no treatment   Past Surgical History  Procedure Laterality Date  . Tubal ligation  1980   Social History  Substance Use Topics  . Smoking status: Never Smoker   . Smokeless tobacco: Not on file  . Alcohol Use: No   family history includes Alcohol abuse in her father and son; Arthritis in her mother; COPD in her mother; Diabetes in her son; Heart disease in her mother; Hypertension in her mother.  ROS:  No headache, visual changes, nausea, vomiting, diarrhea, constipation, dizziness, abdominal pain, skin rash, fevers, chills, night sweats, weight loss, swollen lymph nodes, body aches, joint swelling, muscle aches, chest pain, shortness of breath, mood changes, visual or auditory hallucinations.    Medications: No current outpatient prescriptions on file.   No current facility-administered medications for this visit.   No Known Allergies   Exam:  BP 124/76 mmHg  Pulse 67  Wt 155 lb (70.308 kg) General: Well Developed, well nourished, and in no acute distress.  Neuro/Psych: Alert and oriented x3, extra-ocular muscles intact, able to move all 4 extremities, sensation grossly intact. Skin: Warm and dry, no rashes noted.  Respiratory: Not using accessory muscles, speaking  in full sentences, trachea midline.  Cardiovascular: Pulses palpable, no extremity edema. Abdomen: Does not appear distended. MSK: Back: Nontender to spinal midline normal back motion Right hip: Normal flexion and extension. Normal internal rotation. External rotation limited by about 10 compared to the contralateral left hip. Left hip normal motion Hip strength: 4/5 bilaterally with pain The right hip is tender to palpation of the posterior aspect of the greater trochanter. The left is nontender. Right hip pain in the gluteus medius with flexion and adduction of the hip across the body stretching the IT band area. Negative Faber test bilaterally  Lower extremity strength is equal and normal throughout, aside from noted above. Normal gait.  X-ray right hip pending   No results found for this or any previous visit (from the past 24 hour(s)). No results found.   Assessment and Plan: 75 y.o. female with right hip pain very likely greater trochanteric or sinus or gluteus medius tendinitis. Plan for home exercise program physical therapy and over-the-counter pain medications. Ex Return in 4-6 weeks Hip x-ray pending.   Discussed warning signs or symptoms. Please see discharge instructions. Patient expresses understanding.  CC: .METHENEY,CATHERINE, MD

## 2016-05-05 ENCOUNTER — Ambulatory Visit (INDEPENDENT_AMBULATORY_CARE_PROVIDER_SITE_OTHER): Payer: Commercial Managed Care - HMO | Admitting: Physical Therapy

## 2016-05-05 ENCOUNTER — Encounter: Payer: Self-pay | Admitting: Physical Therapy

## 2016-05-05 ENCOUNTER — Encounter (INDEPENDENT_AMBULATORY_CARE_PROVIDER_SITE_OTHER): Payer: Self-pay

## 2016-05-05 DIAGNOSIS — M25551 Pain in right hip: Secondary | ICD-10-CM | POA: Diagnosis not present

## 2016-05-05 DIAGNOSIS — M545 Low back pain, unspecified: Secondary | ICD-10-CM

## 2016-05-05 DIAGNOSIS — M6281 Muscle weakness (generalized): Secondary | ICD-10-CM | POA: Diagnosis not present

## 2016-05-05 NOTE — Therapy (Signed)
[facility] [place] [place], [place], [address] Phone: [phone]   Fax:  [phone]  Physical Therapy Evaluation  Patient Details  Name: Crystal Ward MRN: [id number] Date of Birth: [birth year] Referring Provider: Dr Steva Colder  Encounter Date: 05/05/2016      PT End of Session - 05/05/16 1514    Visit Number 1   Number of Visits 8   Date for PT Re-Evaluation 06/02/16   PT Start Time [id number]   PT Stop Time 1559   PT Time Calculation (min) 45 min   Activity Tolerance Patient tolerated treatment well      Past Medical History:  Diagnosis Date  . Cancer ([place]) 11-2008   LT breast/no biopsy/no treatment    Past Surgical History:  Procedure Laterality Date  . TUBAL LIGATION  1980    There were no vitals filed for this visit.       Subjective Assessment - 05/05/16 1514    Subjective Patient reports she started having Rt hip pain with stepping funny. The pain was so bad she was immoblized until the pain passed. It would take 3-5 min for it to settle down. Also having something happening in the Lt hip/sciatica. She is doing step ups on a stair, sidleying hip stretch, SLR, KTC stretch and bycyles in supine. She hasn't had any issues in her hips since starting the exercise, only some back issues now. .    Diagnostic tests x-ray arthritis   Patient Stated Goals wants to get rid of her pain   Currently in Pain? Yes   Pain Score 2    Pain Location Back   Pain Orientation Mid   Pain Descriptors / Indicators Discomfort   Pain Type Acute pain   Aggravating Factors  not sure   Pain Relieving Factors rest            [facility] PT Assessment - 05/05/16 0001      Assessment   Medical Diagnosis Rt hip pain   Referring Provider Dr Steva Colder   Onset Date/Surgical Date 04/04/16   Hand Dominance Right   Next MD Visit only as needed   Prior Therapy none     Precautions   Precautions None     Balance Screen   Has the  patient fallen in the past 6 months No   Has the patient had a decrease in activity level because of a fear of falling?  No   Is the patient reluctant to leave their home because of a fear of falling?  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   Leisure working around her property, Medical illustrator, pool and read     Observation/Other Assessments   Focus on Therapeutic Outcomes (FOTO)  18% limited     Functional Tests   Functional tests Squat;Single leg stance     Squat   Comments WNL     Single Leg Stance   Comments bilat WNL     Posture/Postural Control   Posture/Postural Control Postural limitations   Postural Limitations Rounded Shoulders;Increased lumbar lordosis     ROM / Strength   AROM / PROM / Strength AROM;Strength  AROM   AROM Assessment Site Lumbar   Lumbar Flexion WNL   Lumbar Extension WNL   Lumbar - Right Side Bend WNL  pulling in low back   Lumbar - Left Side Bend WNL  pulling in low back.    Lumbar - Right Rotation WNL   Lumbar - Left Rotation WNL     Strength   Strength Assessment Site Knee;Hip;Ankle;Lumbar   Right/Left Hip Left;Right   Right Hip Flexion 4-/5  with pain   Right Hip Extension 5/5   Right Hip ABduction 5/5   Left Hip Flexion 5/5   Left Hip Extension 5/5   Left Hip ABduction --  5-/5   Right/Left Knee --  bilat WNL   Right/Left Ankle --  bilat WNL   Lumbar Flexion --  TA poor   Lumbar Extension --  multifidi good (-)      Palpation   Spinal mobility CPA and Lt UPA lumbar mobs good, tight and tender with Rt UPA mobs LL3-T12   Palpation comment tight and tender in bilat lumbar paraspinals Rt > Lt, Lt QL and bilat piriformis muscles.                     [facility] Adult PT Treatment/Exercise - 05/05/16 0001      Exercises   Exercises Lumbar     Lumbar Exercises: Stretches   ITB Stretch 2 reps;30 seconds  cross body with strap     Lumbar Exercises: Supine   Ab Set 5 reps;5 seconds   Clam 10 reps   Heel Slides 10 reps   Bent Knee Raise 10 reps                PT Education - 05/05/16 1549    Education provided Yes   Education Details HEP   Person(s) Educated Patient   Methods Explanation;Demonstration;Handout   Comprehension Returned demonstration;Verbalized understanding;Verbal cues required             PT Long Term Goals - 05/05/16 1557      PT LONG TERM GOAL #1   Title I with advanced HEP ( 06/02/16)    Time 4   Period Weeks   Status New     PT LONG TERM GOAL #2   Title demo strong contraction of TA with core work ( 06/02/16)    Time 4   Period Weeks   Status New     PT LONG TERM GOAL #3   Title report no pain with work around her property in her hips and low back ( 06/02/16)    Time 4   Period Weeks   Status New     PT LONG TERM GOAL #4   Title maintain FOTO =/> 5 pts from 18, CI/CJ level ( 06/02/16)    Time 4   Period Weeks   Status New               Plan - 05/05/16 1549    Clinical Impression Statement 75 yo very active female presents with Rt hip pain that more recently has developed into low back and Lt sciatica pain. She has some core and hip weakness and improper form with some of the exercises she was performing that could be leading to the back issues.     Rehab Potential Excellent   PT Frequency 2x / week   PT Duration 4 weeks   PT Treatment/Interventions Traction;Moist Heat;Therapeutic exercise;Dry needling;Manual techniques;Taping;Patient/family education;Electrical Stimulation;Cryotherapy   PT Next Visit  Plan progress core and hip strengthening, modalities and manual work PRN for tight piriformis and paraspinals       Patient will benefit from skilled  therapeutic intervention in order to improve the following deficits and impairments:  Pain, Decreased strength, Impaired flexibility  Visit Diagnosis: Muscle weakness (generalized) - Plan: PT plan of care cert/re-cert  Pain in right hip - Plan: PT plan of care cert/re-cert  Bilateral low back pain without sciatica - Plan: PT plan of care cert/re-cert     Problem List Patient Active Problem List   Diagnosis Date Noted  . Right hip pain 04/14/2016  . CKD (chronic kidney disease) stage 3, GFR 30-59 ml/min 04/05/2016  . Osteopenia 07/19/2014  . Postmenopausal status 09/25/2010  . NEOPLASM, MALIGNANT, LEFT BREAST 08/04/2009    Jeral Pinch PT  05/05/2016, 4:02 PM  [facility] [place], [place], [address] Phone: [phone]   Fax:  [phone]  Name: Avariella Bazer MRN: [id number] Date of Birth: 10-20-1940

## 2016-05-05 NOTE — Patient Instructions (Addendum)
  Abdominal Bracing With Pelvic Floor (Hook-Lying)   With neutral spine, tighten pelvic floor and abdominals. Hold 5 seconds. Repeat __10_ times. Do _1__ times a day.  Knee to Chest: Transverse Plane Stability   Bring one knee up, then return. Be sure pelvis does not roll side to side. Keep pelvis still. Lift knee __10_ times each leg. Restabilize pelvis. Repeat with other leg. Do _1-2__ sets, _1__ times per day.  Hip External Rotation With Pillow: Transverse Plane Stability   One knee bent, one leg straight, on pillow. Slowly roll bent knee out. Be sure pelvis does not rotate. Do _10__ times. Restabilize pelvis. Repeat with other leg. Do _1-2__ sets, _1__ times per day.  Heel Slide: 4-10 Inches - Transverse Plane Stability   Slide heel 4 inches down. Be sure pelvis does not rotate. Do _10__ times. Restabilize pelvis. Repeat with other leg. Do __1_ sets, _1__ times per day.  Outer Hip Stretch: Reclined IT Band Stretch (Strap)    Strap around opposite foot, pull across only as far as possible with shoulders on mat. Hold for __45__ sec. Repeat __2__ times each leg.  Copyright  VHI. All rights reserved.     [facility] [place], [place] [address]  [phone] (office) [phone] (fax)

## 2016-05-12 ENCOUNTER — Ambulatory Visit (INDEPENDENT_AMBULATORY_CARE_PROVIDER_SITE_OTHER): Payer: Commercial Managed Care - HMO | Admitting: Physical Therapy

## 2016-05-12 DIAGNOSIS — M545 Low back pain, unspecified: Secondary | ICD-10-CM

## 2016-05-12 DIAGNOSIS — M6281 Muscle weakness (generalized): Secondary | ICD-10-CM

## 2016-05-12 DIAGNOSIS — M25551 Pain in right hip: Secondary | ICD-10-CM

## 2016-05-12 NOTE — Therapy (Signed)
[place], [place], [address] Phone: [phone]   Fax:  [phone]  Physical Therapy Treatment  Patient Details  Name: Crystal Ward MRN: [id number] Date of Birth: 02/19/1941 Referring Provider: Dr. Georgina Snell   Encounter Date: 05/12/2016      PT End of Session - 05/12/16 1601    Visit Number 2   Number of Visits 8   Date for PT Re-Evaluation 06/02/16   PT Start Time 1600   PT Stop Time 1642   PT Time Calculation (min) 42 min   Activity Tolerance Patient tolerated treatment well      Past Medical History:  Diagnosis Date  . Cancer ([place]) 11-2008   LT breast/no biopsy/no treatment    Past Surgical History:  Procedure Laterality Date  . TUBAL LIGATION  1980    There were no vitals filed for this visit.      Subjective Assessment - 05/12/16 1601    Subjective Crystal Ward reports she has been doing her exercises and noticed that she is able to stretch further and also activate TA without having to make "bzzzz" sound.  She had lower back pain this morning, but it resolved with stretches.    Currently in Pain? No/denies            [facility] PT Assessment - 05/12/16 0001      Assessment   Medical Diagnosis Rt hip pain   Referring Provider Dr. Georgina Snell    Onset Date/Surgical Date 04/04/16   Hand Dominance Right   Next MD Visit only as needed   Prior Therapy none          [facility] Adult PT Treatment/Exercise - 05/12/16 0001      Exercises   Exercises Lumbar;Knee/Hip     Lumbar Exercises: Stretches   Lower Trunk Rotation 5 reps   ITB Stretch 2 reps;30 seconds  cross body with strap, each leg. VC for form.    Piriformis Stretch 3 reps;30 seconds each leg _ 2 reps in supine each leg, 1 rep in sitting (pt reported tightness in each hip)      Lumbar Exercises: Aerobic   Stationary Bike NuStep L5: 4.5 min      Lumbar Exercises: Supine   Ab Set 10 reps;5 seconds   AB Set Limitations required  multiple tactile cues for improved engagement.    Clam 10 reps  with TA contraction    Heel Slides 10 reps  with TA contraction   Bent Knee Raise 10 reps  with TA contraction      Lumbar Exercises: Sidelying   Hip Abduction 1 second;15 reps   Hip Abduction Limitations Unable to complete LLE due to pain down LLE.  pain resolved with stretches.            PT Long Term Goals - 05/12/16 1645      PT LONG TERM GOAL #1   Title I with advanced HEP ( 06/02/16)    Time 4   Period Weeks   Status On-going     PT LONG TERM GOAL #2   Title demo strong contraction of TA with core work ( 06/02/16)    Time 4   Period Weeks   Status On-going     PT LONG TERM GOAL #3   Title report no pain with work around her property in her hips and low back ( 06/02/16)    Time 4   Period Weeks   Status On-going  PT LONG TERM GOAL #4   Title maintain FOTO =/> 5 pts from 18, CI/CJ level ( 06/02/16)    Time 4   Period Weeks   Status On-going               Plan - 05/12/16 1632    Clinical Impression Statement Pt continues to have difficulty engaging TA muscle.  She required multiple cues for improved contraction.   She reported Lt hip/LLE pain with Lt hip abduction exercise, stopped at 5 reps and stretched.  Pain resolved in hip.    Pt reports if she is doing well next session, she would like to d/c to HEP due to upcoming vacation.    Rehab Potential Excellent   PT Frequency 2x / week   PT Duration 4 weeks   PT Treatment/Interventions Traction;Moist Heat;Therapeutic exercise;Dry needling;Manual techniques;Taping;Patient/family education;Electrical Stimulation;Cryotherapy   PT Next Visit Plan Assess progress towards goals.   Progress hip/core strengthening as tolerated. Manual to Lt /Rt hip as indicated.    Consulted and Agree with Plan of Care Patient      Patient will benefit from skilled therapeutic intervention in order to improve the following deficits and impairments:  Pain, Decreased  strength, Impaired flexibility  Visit Diagnosis: Muscle weakness (generalized)  Pain in right hip  Bilateral low back pain without sciatica     Problem List Patient Active Problem List   Diagnosis Date Noted  . Right hip pain 04/14/2016  . CKD (chronic kidney disease) stage 3, GFR 30-59 ml/min 04/05/2016  . Osteopenia 07/19/2014  . Postmenopausal status 09/25/2010  . NEOPLASM, MALIGNANT, LEFT BREAST 08/04/2009   Kerin Perna, PTA 05/12/16 4:46 PM  [facility] [place], [place], [address] Phone: [phone]   Fax:  [phone]  Name: Crystal Ward MRN: [id number] Date of Birth: 12/07/1940

## 2016-05-14 ENCOUNTER — Encounter: Payer: Commercial Managed Care - HMO | Admitting: Physical Therapy

## 2016-05-20 DIAGNOSIS — M5430 Sciatica, unspecified side: Secondary | ICD-10-CM | POA: Diagnosis not present

## 2016-05-20 DIAGNOSIS — M9904 Segmental and somatic dysfunction of sacral region: Secondary | ICD-10-CM | POA: Diagnosis not present

## 2016-05-20 DIAGNOSIS — M5137 Other intervertebral disc degeneration, lumbosacral region: Secondary | ICD-10-CM | POA: Diagnosis not present

## 2016-05-20 DIAGNOSIS — M791 Myalgia: Secondary | ICD-10-CM | POA: Diagnosis not present

## 2016-09-14 ENCOUNTER — Ambulatory Visit (INDEPENDENT_AMBULATORY_CARE_PROVIDER_SITE_OTHER): Payer: Commercial Managed Care - HMO | Admitting: Family Medicine

## 2016-09-14 ENCOUNTER — Encounter: Payer: Self-pay | Admitting: Family Medicine

## 2016-09-14 VITALS — BP 122/60 | HR 77 | Temp 98.4°F | Ht 66.0 in | Wt 160.0 lb

## 2016-09-14 DIAGNOSIS — J209 Acute bronchitis, unspecified: Secondary | ICD-10-CM

## 2016-09-14 DIAGNOSIS — R509 Fever, unspecified: Secondary | ICD-10-CM | POA: Diagnosis not present

## 2016-09-14 LAB — POCT INFLUENZA A/B
Influenza A, POC: NEGATIVE
Influenza B, POC: NEGATIVE

## 2016-09-14 MED ORDER — DOXYCYCLINE HYCLATE 100 MG PO TABS: 100.0000 mg | ORAL_TABLET | Freq: Two times a day (BID) | ORAL | 0 refills | Status: DC

## 2016-09-14 NOTE — Patient Instructions (Addendum)

## 2016-09-14 NOTE — Progress Notes (Signed)
   Subjective:    Patient ID: Crystal Ward, female    DOB: [birth year], 75 y.o.   MRN: [id number]  HPI 4 days ago started feeling sick with cough, chills.  Throat is scratchy.   Gargling with peroxide.  Taking mucinex.  Fever 100.6 yesterday. Cough is productive.  Complains of extreme fatigue and chills.  Review of Systems     Objective:   Physical Exam  Constitutional: She is oriented to person, place, and time. She appears well-developed and well-nourished.  HENT:  Head: Normocephalic and atraumatic.  Right Ear: External ear normal.  Left Ear: External ear normal.  Nose: Nose normal.  Mouth/Throat: Oropharynx is clear and moist.  TMs and canals are clear.   Eyes: Conjunctivae and EOM are normal. Pupils are equal, round, and reactive to light.  Neck: Neck supple. No thyromegaly present.  Cardiovascular: Normal rate, regular rhythm and normal heart sounds.   Pulmonary/Chest: Effort normal and breath sounds normal. She has no wheezes.  Lymphadenopathy:    She has no cervical adenopathy.  Neurological: She is alert and oriented to person, place, and time.  Skin: Skin is warm and dry.  Psychiatric: She has a normal mood and affect.        Assessment & Plan:  Acute bronchitis-likely viral. Though I did give her prescription to fill over the weekend if she feels like she still running a fever, gets more short of breath, or is just not feeling better after one week if we are getting ready to head into the Christmas holiday and her office will be closed for extended period of time. She understands that it is likely viral and will continue with over-the-counter treatment for now. Lou test negative.

## 2016-12-10 ENCOUNTER — Telehealth: Payer: Self-pay | Admitting: Family Medicine

## 2017-03-21 IMAGING — US US RENAL
1 series · 14 of 25 positions shown · non-contrast
Comparison: None.

CLINICAL DATA: Decreasing renal function

EXAM:
RENAL / URINARY TRACT ULTRASOUND COMPLETE

[Series 1: us renal · 0.16mm/px · 14 of 32 slices shown]
[im 1/32]
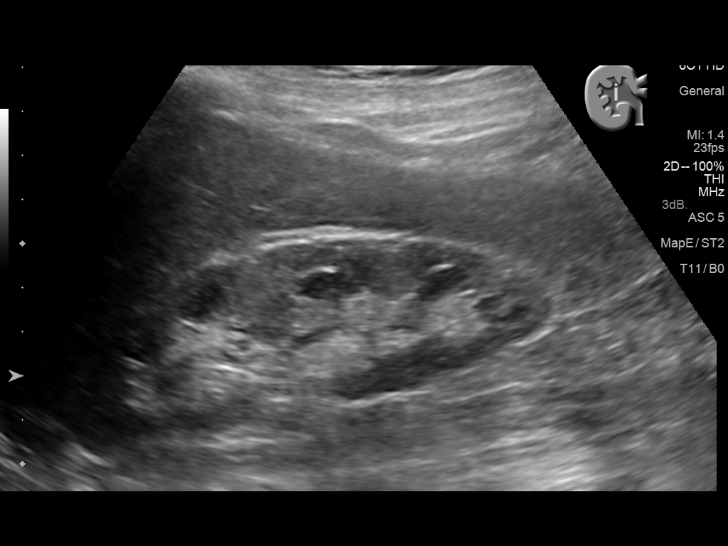
[im 3/32]
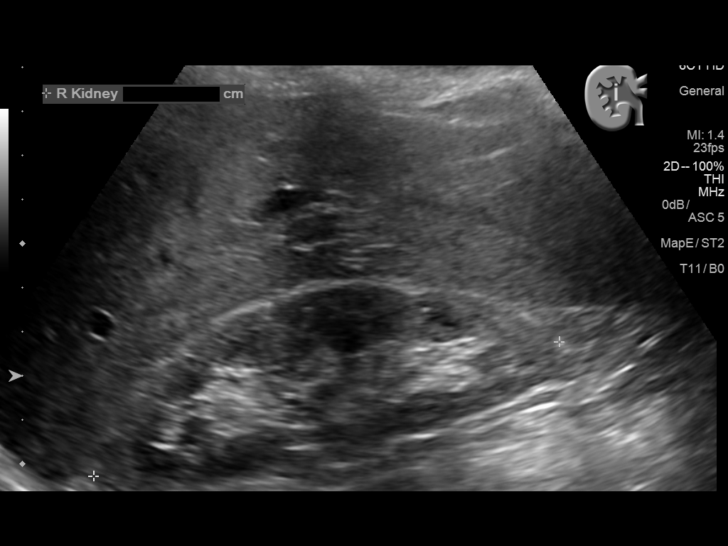
[im 6/32]
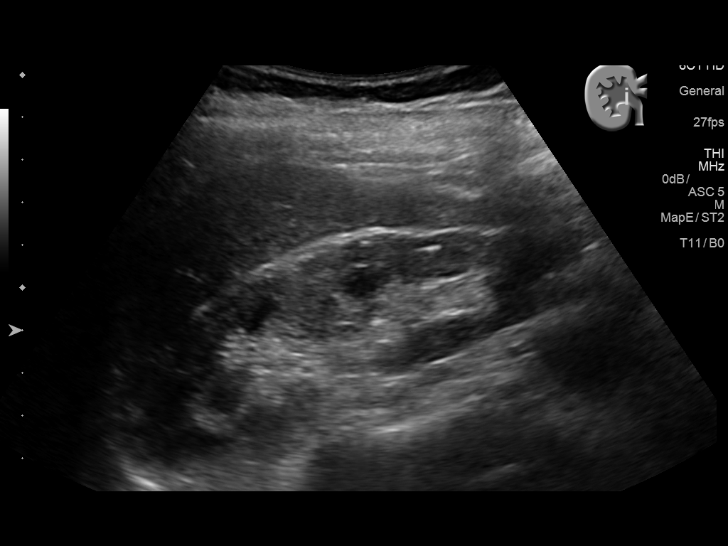
[im 8/32]
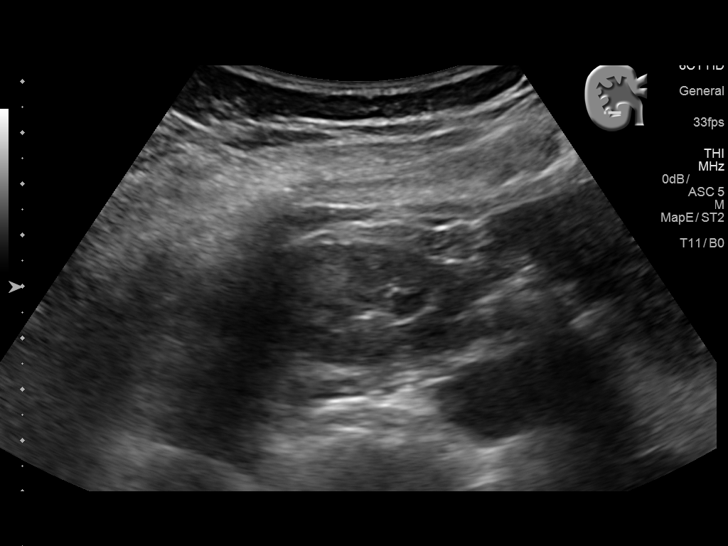
[im 11/32]
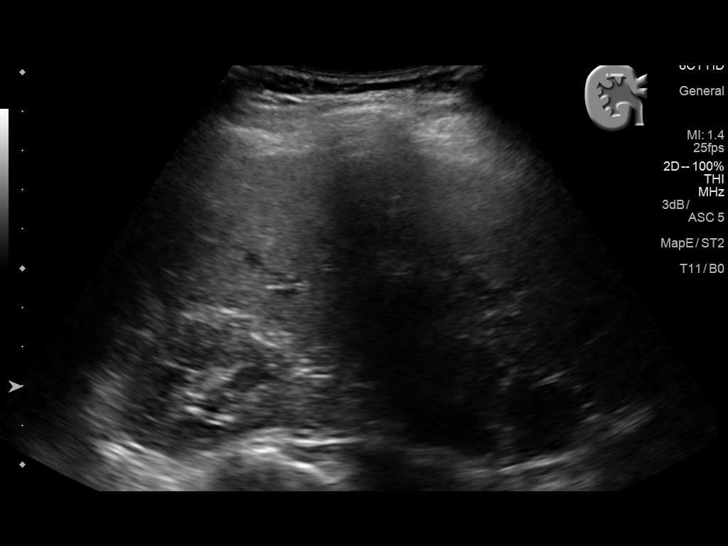
[im 12/32]
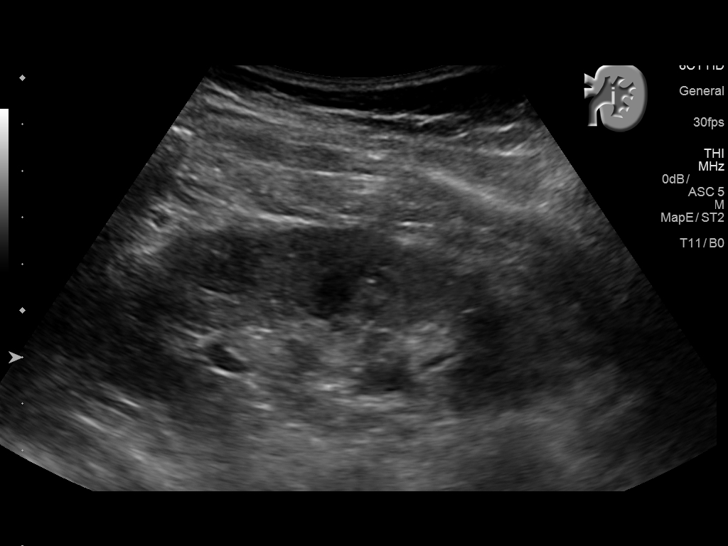
[im 15/32]
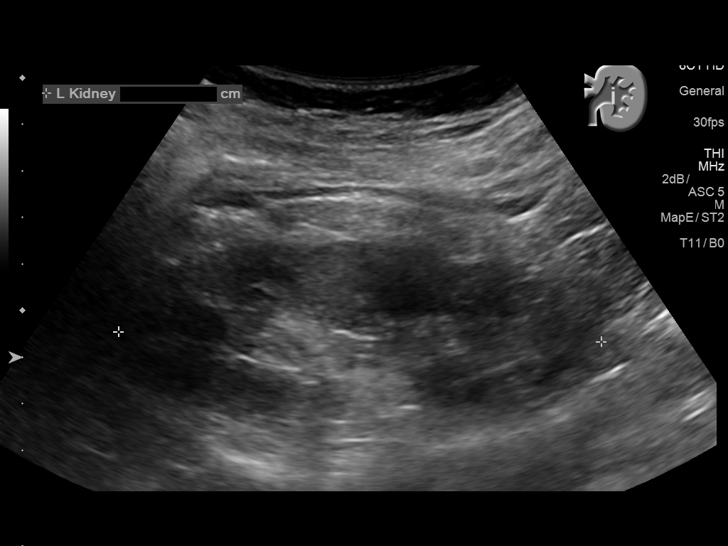
[im 17/32]
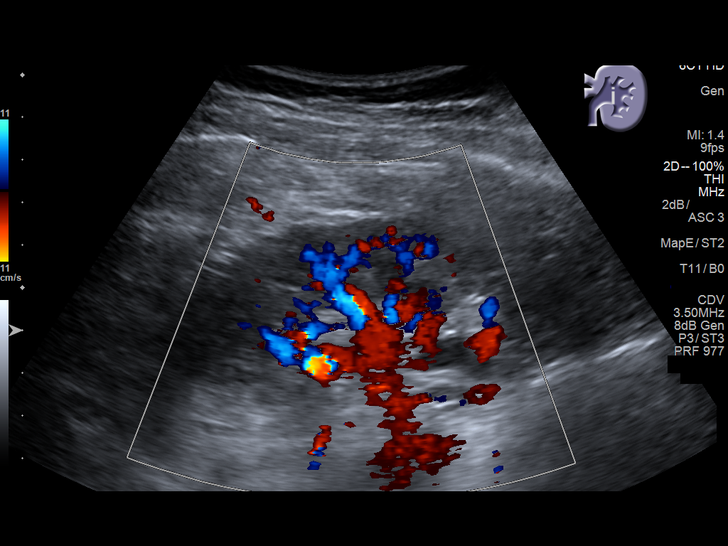
[im 20/32]
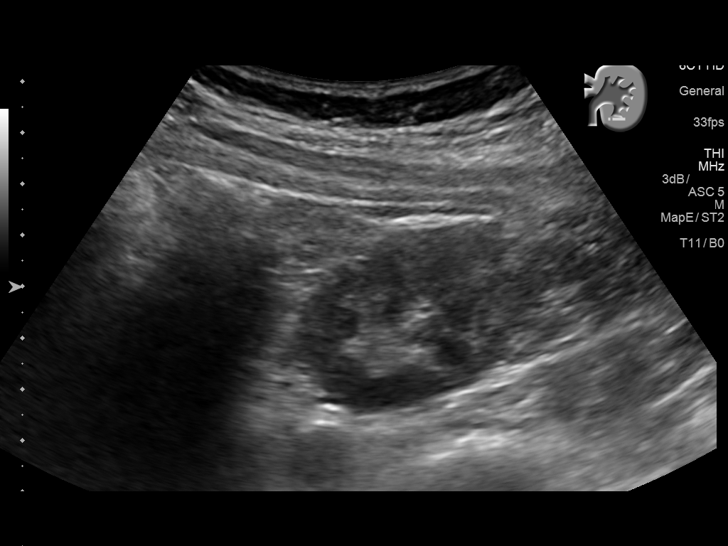
[im 21/32]
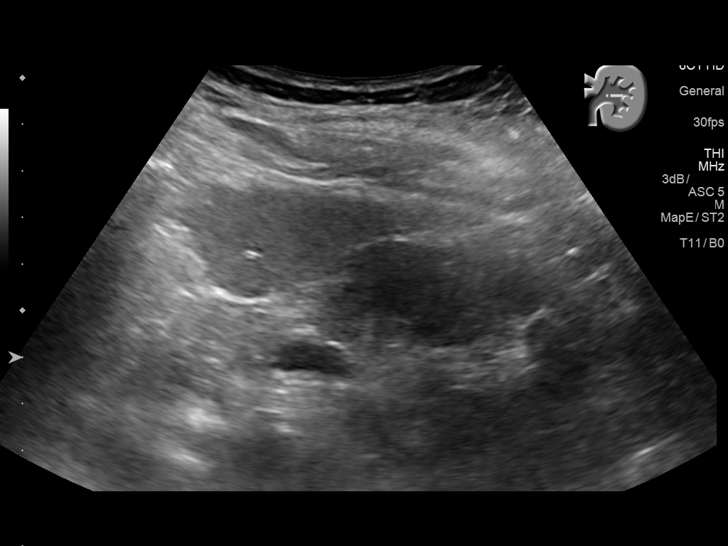
[im 24/32]
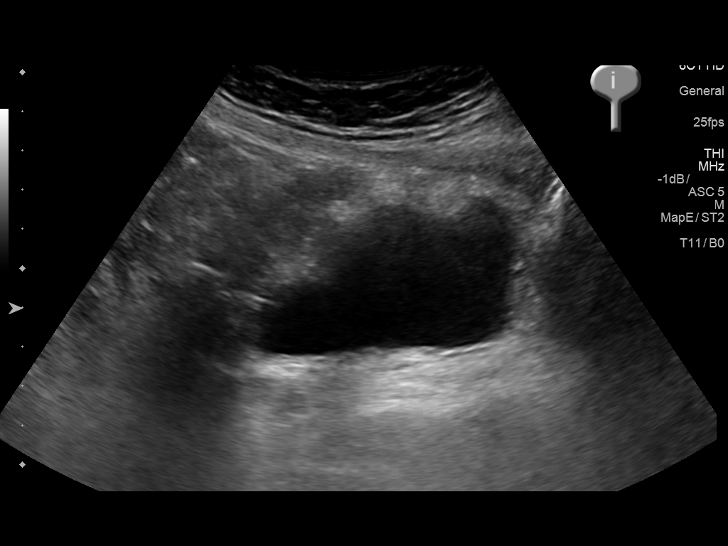
[im 26/32]
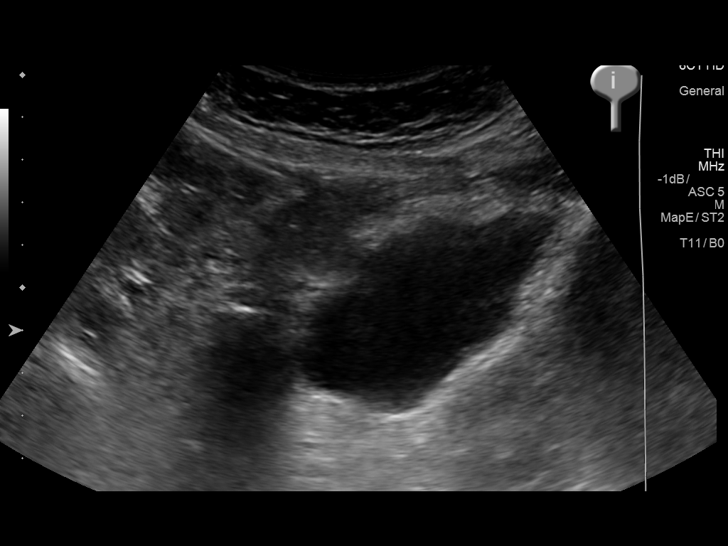
[im 29/32]
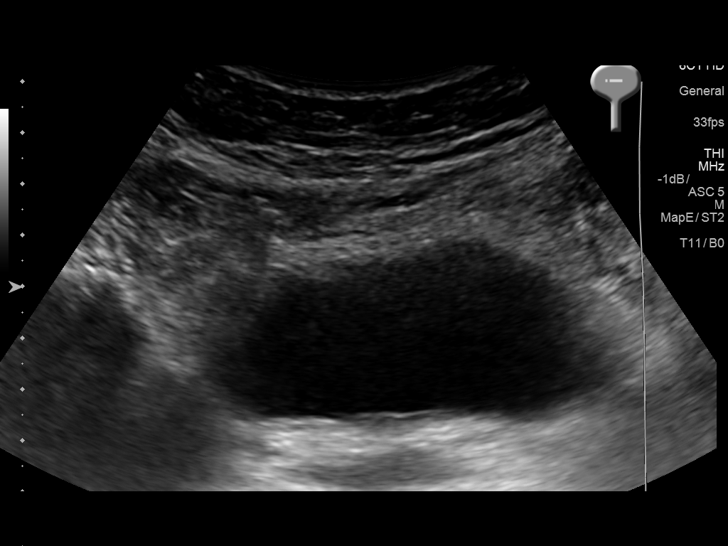
[im 32/32]
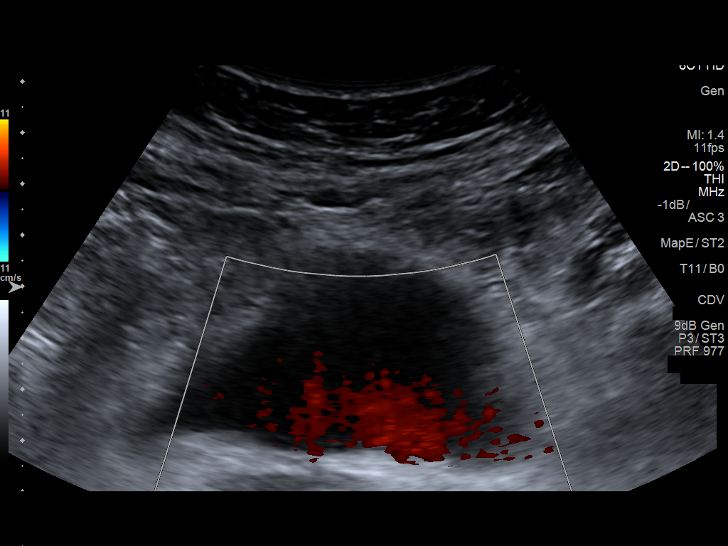

[14 of 25 positions shown; findings below may reference images not displayed]

FINDINGS: Right Kidney:

Length: 11 cm. Echogenicity within normal limits. No mass or
hydronephrosis visualized.

Left Kidney:

Length: 10.4 cm.. Echogenicity within normal limits. No mass or
hydronephrosis visualized.

Bladder:

Appears normal for degree of bladder distention.
IMPRESSION: No acute abnormality noted.

## 2017-04-12 ENCOUNTER — Ambulatory Visit (INDEPENDENT_AMBULATORY_CARE_PROVIDER_SITE_OTHER): Payer: Medicare HMO | Admitting: Family Medicine

## 2017-04-12 VITALS — BP 134/71 | HR 81 | Wt 153.0 lb

## 2017-04-12 DIAGNOSIS — R2242 Localized swelling, mass and lump, left lower limb: Secondary | ICD-10-CM

## 2017-04-12 DIAGNOSIS — R42 Dizziness and giddiness: Secondary | ICD-10-CM | POA: Diagnosis not present

## 2017-04-12 DIAGNOSIS — H6121 Impacted cerumen, right ear: Secondary | ICD-10-CM | POA: Diagnosis not present

## 2017-04-12 NOTE — Progress Notes (Signed)
Subjective:    Patient ID: Crystal Ward, female    DOB: [birth year], 76 y.o.   MRN: [id number]  HPI 76 year old female with a history of CKD 3 comes in today complaining of A lump on her left posterior thigh above the knee area. She noticed about 2 weeks ago. It hasn't been painful or tender. But she is concerned. She like it to be evaluated further.  She's also been experiencing some intermittent dizziness. More so when she bends over and then stands up quickly. She does a lot of painting and gets up on ladders etc. Normally this doesn't bother her but for the last several weeks she's been feeling a little lightheaded again when she changes position quickly. It is usually very brief. She wonders if it could be secondary to low iron. She did have vertigo years ago but this doesn't feel quite as intense as that. She did hit her Head on a Week or 2 ago and has just felt like her balance has been off just a little bit since then.  Impaction of right ear canal-she's been using eardrops at home and wanted me to recheck her ear today.   Review of Systems   No chest pain or shortness of breath.  BP 134/71   Pulse 81   Wt 153 lb (69.4 kg)   BMI 24.69 kg/m     No Known Allergies  Past Medical History:  Diagnosis Date  . 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 TUBAL LIGATION  1980    Social History   Social History  . Marital status: Married    Spouse name: Kerry Dory  . Number of children: N/A  . Years of education: N/A   Occupational History  . Not on file.   Social History Main Topics  . Smoking status: Never Smoker  . Smokeless tobacco: Not on file  . Alcohol use No  . Drug use: Unknown  . Sexual activity: Not on file     Comment: Owns cleaning business, 3 sons, regularly exercises, finished HS.   Other Topics Concern  . Not on file   Social History Narrative  . No narrative on file    Family  History  Problem Relation Age of Onset  . Hypertension Mother   . Heart disease Mother        CHF  . COPD Mother   . Arthritis Mother   . Alcohol abuse Father   . Alcohol abuse Son   . Diabetes Son        type 2    Outpatient Encounter Prescriptions as of 04/12/2017  Medication Sig  . [DISCONTINUED] doxycycline (VIBRA-TABS) 100 MG tablet Take 1 tablet (100 mg total) by mouth 2 (two) times daily. (Patient not taking: Reported on 04/12/2017)   No facility-administered encounter medications on file as of 04/12/2017.           Objective:   Physical Exam  Constitutional: She is oriented to person, place, and time. She appears well-developed and well-nourished.  Left TM and canal are clear. Right ear blocked by cerumen.    HENT:  Head: Normocephalic and atraumatic.  Left Ear: External ear normal.  Right cerumen impaction of riight canal.   Neck: Neck supple. No thyromegaly present.  Cardiovascular: Normal rate, regular rhythm and normal heart sounds.   Pulmonary/Chest: Effort normal and breath sounds normal.  Lymphadenopathy:    She has no cervical  adenopathy.  Neurological: She is alert and oriented to person, place, and time.  Skin: Skin is warm and dry.  She does have a oval-shaped firm mass on the left posterior thigh proximally 2 and half by 4 cm in size. No irregularity. It feels smooth most consistent with a possible lipoma.  Psychiatric: She has a normal mood and affect. Her behavior is normal.        Assessment & Plan:  Mass of left thigh-recommend ultrasound for further evaluation. Suspect lipoma.They were able to get her scheduled this afternoon so we'll call her once we have the results.  Dizziness with change of position-we will do orthostatics.  Orthostatics are normal. She did have cerumen impaction in the right tympanic membrane. No cardiac symptoms such as chest pain etc.  Cerumen impaction right ear canal. Irrigated today.Patient tolerated well. Required  irrigation and manual disimpaction. Call if any problems or side effects or concerns.  Indication: Cerumen impaction of the ear(s) Medical necessity statement: On physical examination, cerumen impairs clinically significant portions of the external auditory canal, and tympanic membrane. Noted obstructive, copious cerumen that cannot be removed without magnification and instrumentations requiring physician skills Consent: Discussed benefits and risks of procedure and verbal consent obtained Procedure: Patient was prepped for the procedure. Utilized an otoscope to assess and take note of the ear canal, the tympanic membrane, and the presence, amount, and placement of the cerumen. Gentle water irrigation and soft plastic curette was utilized to remove cerumen.  Post procedure examination: shows cerumen was completely removed. Patient tolerated procedure well. The patient is made aware that they may experience temporary vertigo, temporary hearing loss, and temporary discomfort. If these symptom last for more than 24 hours to call the clinic or proceed to the ED.    Declined Pneumovax 23 vaccine today.

## 2017-04-12 NOTE — Patient Instructions (Signed)
Recommend using my fitness pal to help track calories. It's a smart phone application to be downloaded.

## 2017-04-14 ENCOUNTER — Ambulatory Visit (INDEPENDENT_AMBULATORY_CARE_PROVIDER_SITE_OTHER): Payer: Medicare HMO

## 2017-04-14 DIAGNOSIS — R2242 Localized swelling, mass and lump, left lower limb: Secondary | ICD-10-CM

## 2017-04-14 DIAGNOSIS — R918 Other nonspecific abnormal finding of lung field: Secondary | ICD-10-CM | POA: Diagnosis not present

## 2017-06-29 NOTE — Telephone Encounter (Signed)
Issue resolved.

## 2018-03-26 IMAGING — US US SOFT TISSUE HEAD/NECK
1 series · 12 of 12 positions shown · non-contrast
Comparison: None.

CLINICAL DATA: 76-year-old female with a history of thigh mass for
2 weeks

EXAM:
ULTRASOUND OF SOFT TISSUES
TECHNIQUE: Ultrasound examination of the head and neck soft tissues was
performed in the area of clinical concern.

[Series 1: us soft tissue head/neck · 0.09mm/px · 12 of 12 slices shown]
[im 1/12]
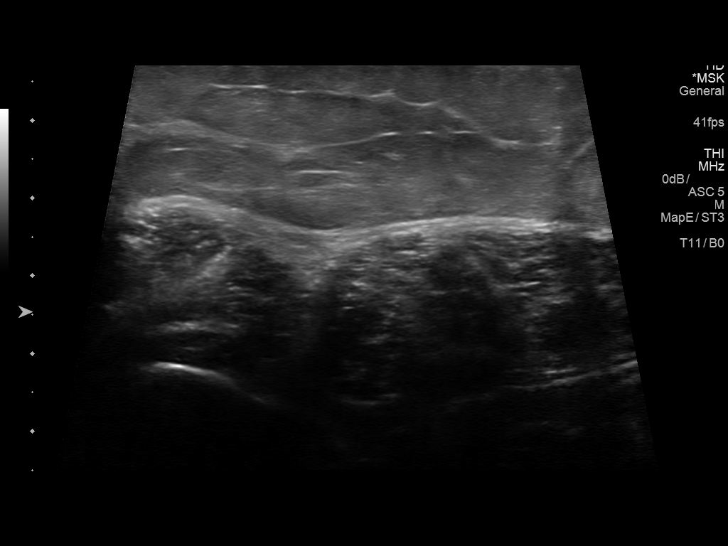
[im 2/12]
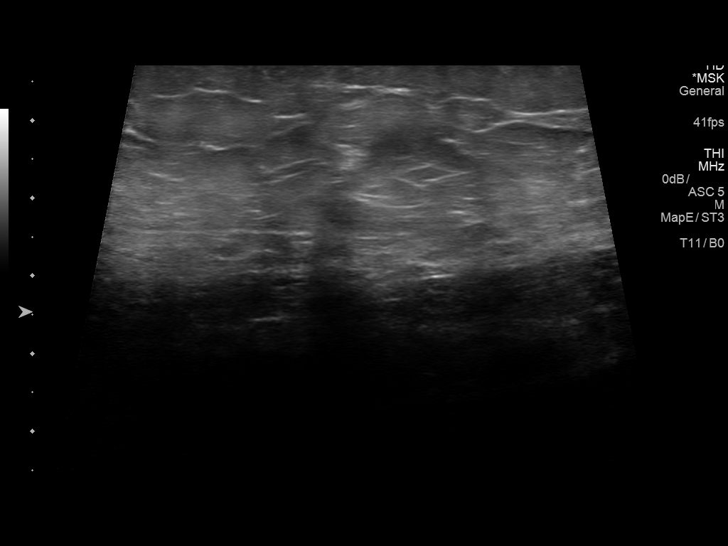
[im 3/12]
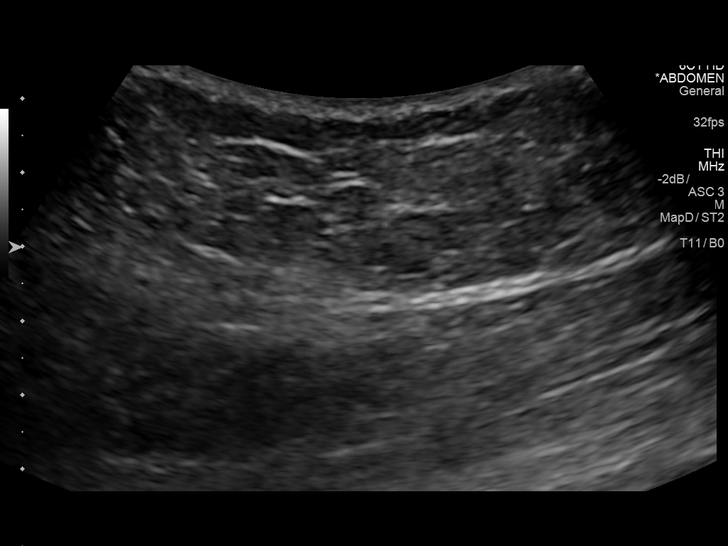
[im 4/12]
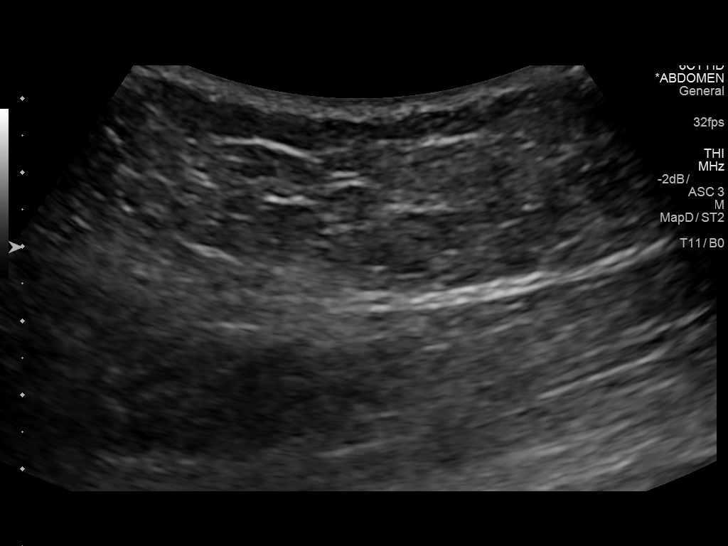
[im 5/12]
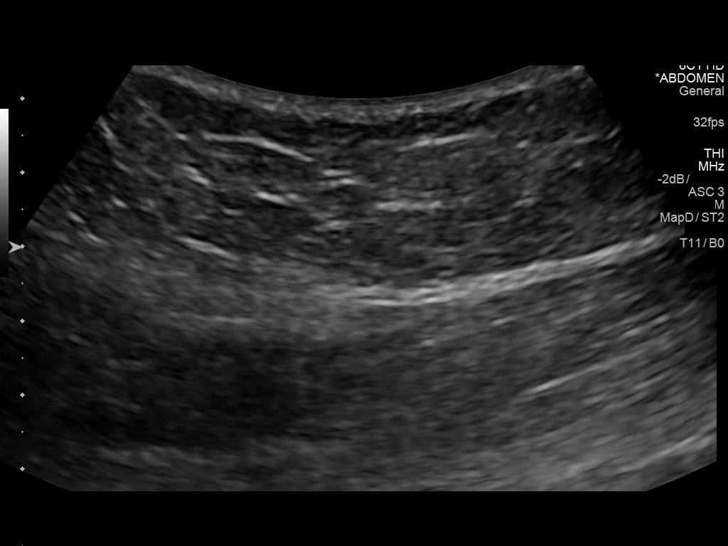
[im 6/12]
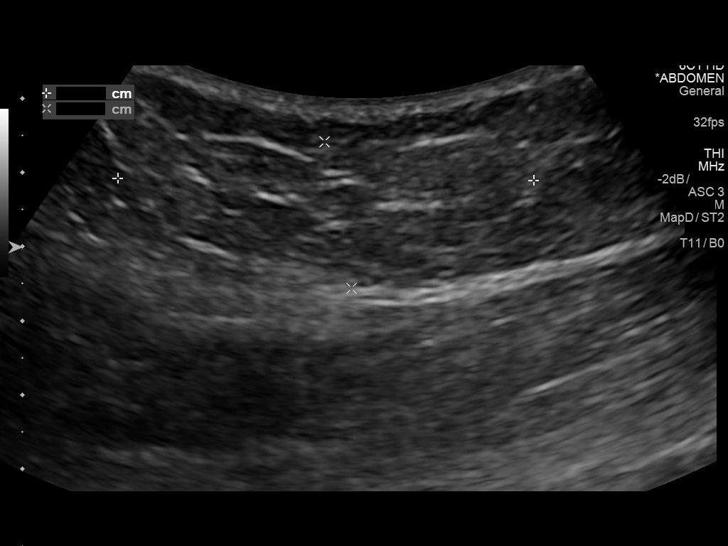
[im 7/12]
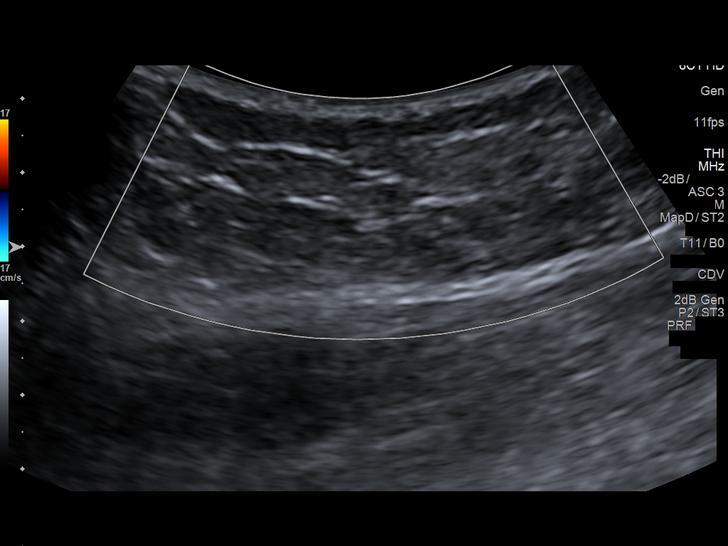
[im 8/12]
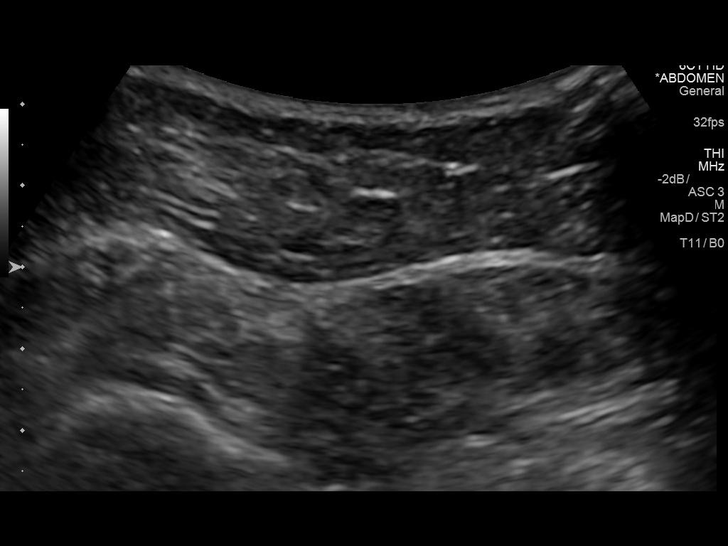
[im 9/12]
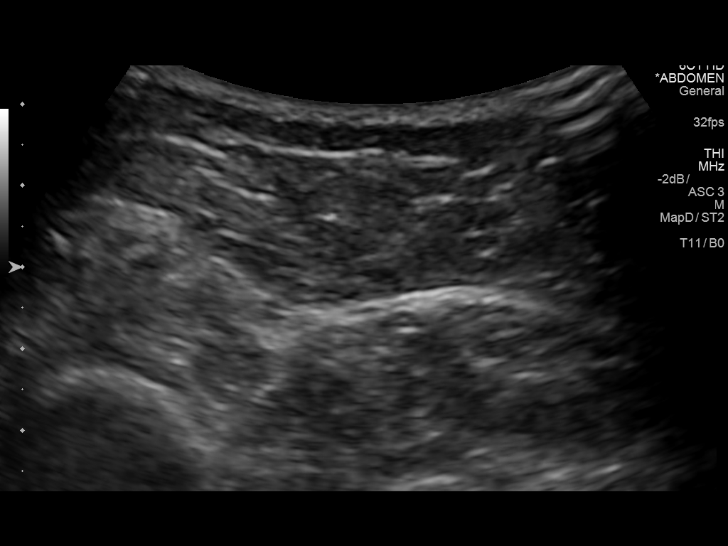
[im 10/12]
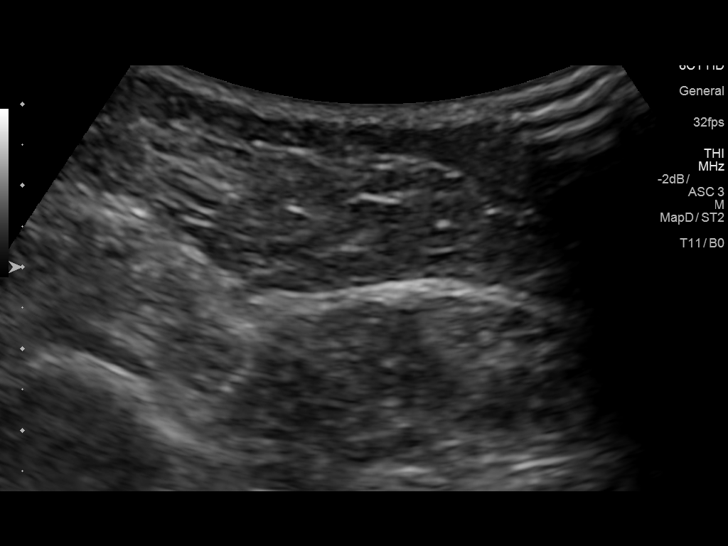
[im 11/12]
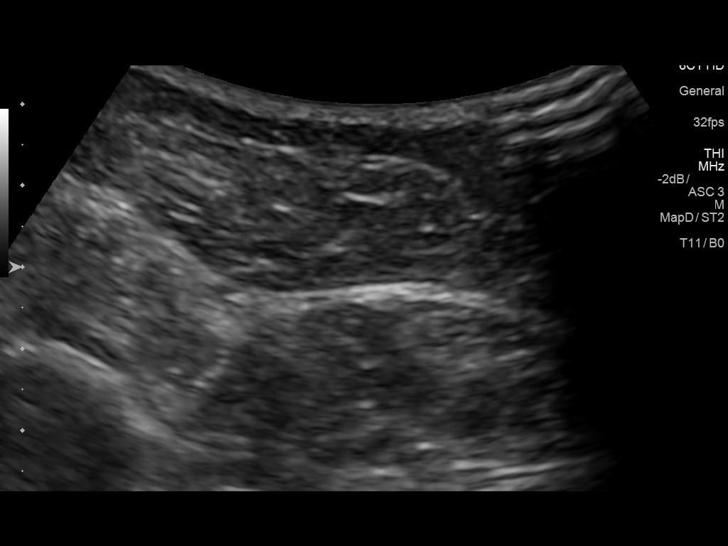
[im 12/12]
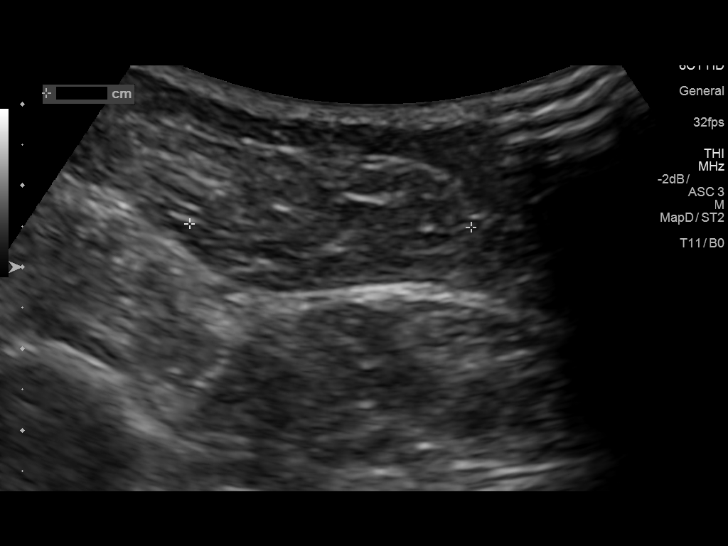

[12 of 12 positions shown; findings below may reference images not displayed]

FINDINGS: Grayscale and color duplex ultrasound performed in the region of
clinical concern.

No focal fluid collection.  No soft tissue mass.

Calipers designate a region of ill-defined subcutaneous/ dermal fat
overlying the musculature with no definite soft tissue lesion.
IMPRESSION: Sonographic survey in the region of clinical concern demonstrates no
definite soft tissue lesion or focal fluid. There is a questionable
region of irregular fat within the superficial tissues which is
nonspecific. This may be artifact on the ultrasound, lipoma, or
sequela of inflammation/trauma.

## 2018-04-20 ENCOUNTER — Ambulatory Visit (INDEPENDENT_AMBULATORY_CARE_PROVIDER_SITE_OTHER): Payer: Medicare HMO | Admitting: Family Medicine

## 2018-04-20 ENCOUNTER — Encounter: Payer: Self-pay | Admitting: Family Medicine

## 2018-04-20 VITALS — BP 138/60 | HR 61 | Ht 66.0 in | Wt 146.0 lb

## 2018-04-20 DIAGNOSIS — N61 Mastitis without abscess: Secondary | ICD-10-CM | POA: Diagnosis not present

## 2018-04-20 MED ORDER — DOXYCYCLINE HYCLATE 100 MG PO TABS: 100.0000 mg | ORAL_TABLET | Freq: Two times a day (BID) | ORAL | 0 refills | Status: DC

## 2018-04-20 NOTE — Patient Instructions (Addendum)
Please call me on Monday with update on the redness.

## 2018-04-20 NOTE — Progress Notes (Signed)
Subjective:    Patient ID: Crystal Ward, female    DOB: [birth year], 77 y.o.   MRN: [id number]  HPI  77 year old postmenopausal female who was diagnosed with left-sided breast cancer in March 2010, and and did not undergo treatment, comes in today complaining of left breast pain particularly around the nipple area for 4 days.  She reports that it sore to touch.  Not on any hormone replacement therapy.  She does not usually get mammograms and in fact declines them.  Now the pain seems more persistent and is getting more uncomfortable.  She has not taken any treatments.    Review of Systems  BP 138/60   Pulse 61   Ht 5\' 6"  (1.676 m)   Wt 146 lb (66.2 kg)   SpO2 99%   BMI 23.57 kg/m     No Known Allergies  Past Medical History:  Diagnosis Date  . 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 TUBAL LIGATION  1980    Social History   Socioeconomic History  . Marital status: Married    Spouse name: Kerry Dory  . Number of children: Not on file  . Years of education: Not on file  . Highest education level: Not on file  Occupational History  . Not on file  Social Needs  . Financial resource strain: Not on file  . Food insecurity:    Worry: Not on file    Inability: Not on file  . Transportation needs:    Medical: Not on file    Non-medical: Not on file  Tobacco Use  . Smoking status: Never Smoker  . Smokeless tobacco: Never Used  Substance and Sexual Activity  . Alcohol use: No  . Drug use: Never  . Sexual activity: Not on file    Comment: Owns cleaning business, 3 sons, regularly exercises, finished HS.  Lifestyle  . Physical activity:    Days per week: Not on file    Minutes per session: Not on file  . Stress: Not on file  Relationships  . Social connections:    Talks on phone: Not on file    Gets together: Not on file    Attends religious service: Not on file    Active member of club or  organization: Not on file    Attends meetings of clubs or organizations: Not on file    Relationship status: Not on file  . Intimate partner violence:    Fear of current or ex partner: Not on file    Emotionally abused: Not on file    Physically abused: Not on file    Forced sexual activity: Not on file  Other Topics Concern  . Not on file  Social History Narrative  . Not on file    Family History  Problem Relation Age of Onset  . Hypertension Mother   . Heart disease Mother        CHF  . COPD Mother   . Arthritis Mother   . Alcohol abuse Father   . Alcohol abuse Son   . Diabetes Son        type 2    Outpatient Encounter Medications as of 04/20/2018  Medication Sig  . doxycycline (VIBRA-TABS) 100 MG tablet Take 1 tablet (100 mg total) by mouth 2 (two) times daily.   No facility-administered encounter medications on file as of 04/20/2018.  Objective:   Physical Exam  Constitutional: She is oriented to person, place, and time. She appears well-developed and well-nourished.  HENT:  Head: Normocephalic and atraumatic.  Eyes: Conjunctivae and EOM are normal.  Cardiovascular: Normal rate.  Pulmonary/Chest: Effort normal.  Musculoskeletal:  Left breast shows significant erythema over the areola, its fairly symmetric.  No cracking of the skin distinct rash or scale.  No drainage from the nipple.  The entire area [address] is indurated and very tender on exam.  No other palpable masses or nodules. no Palpable lymph nodes in the left axilla.  Neurological: She is alert and oriented to person, place, and time.  Skin: Skin is dry. No pallor.  Psychiatric: She has a normal mood and affect. Her behavior is normal.  Vitals reviewed.         Assessment & Plan:  Left breast erythema - possible cellulitis vs Paget's Disease.  We will start by treating for cellulitis and will put her on doxycycline to cover for staph.  I do not see any breaks in the skin or drainage from the  nipple.  It does feel indurated.  Also encouraged her to consider getting a diagnostic mammogram and ultrasound.  She said she might be willing to do an ultrasound if it does not improve after the weekend.  Okay to use cool compresses and ibuprofen as needed for pain relief.

## 2018-05-04 ENCOUNTER — Encounter: Payer: Medicare HMO | Admitting: Family Medicine

## 2018-05-09 ENCOUNTER — Encounter: Payer: Medicare HMO | Admitting: Family Medicine

## 2018-05-22 ENCOUNTER — Ambulatory Visit (INDEPENDENT_AMBULATORY_CARE_PROVIDER_SITE_OTHER): Payer: Medicare HMO | Admitting: Family Medicine

## 2018-05-22 VITALS — BP 126/66 | HR 78 | Ht 66.0 in | Wt 139.0 lb

## 2018-05-22 DIAGNOSIS — R7989 Other specified abnormal findings of blood chemistry: Secondary | ICD-10-CM | POA: Diagnosis not present

## 2018-05-22 DIAGNOSIS — N6320 Unspecified lump in the left breast, unspecified quadrant: Secondary | ICD-10-CM

## 2018-05-22 DIAGNOSIS — Z0000 Encounter for general adult medical examination without abnormal findings: Secondary | ICD-10-CM

## 2018-05-22 DIAGNOSIS — W11XXXA Fall on and from ladder, initial encounter: Secondary | ICD-10-CM | POA: Diagnosis not present

## 2018-05-22 DIAGNOSIS — N183 Chronic kidney disease, stage 3 unspecified: Secondary | ICD-10-CM

## 2018-05-22 DIAGNOSIS — M79651 Pain in right thigh: Secondary | ICD-10-CM

## 2018-05-22 DIAGNOSIS — N6452 Nipple discharge: Secondary | ICD-10-CM

## 2018-05-22 NOTE — Progress Notes (Addendum)
Subjective:   Crystal Ward is a 77 y.o. female who presents for Medicare Annual (Subsequent) preventive examination.  77 year old female who came in approximately 4 weeks ago with some erythema around the left breast.  We discussed working her up for breast cancer since she did have a prior history which she never formally treated with conservative therapy.  She treated with alternative therapies and diet.  I did prescribe an antibiotic in case there was some cellulitis or mastitis did she says the erythema did clean up and she significantly cleaned up her diet.  She is sending her urine off to a provider in the [place] to see if she has breast cancer or not.  She is interested in some imaging but she is not at all interested interested in treatment that would include surgery, chemotherapy, or radiation.  Review of Systems:  Comprehensive ROS is negative.       Objective:     Vitals: BP 126/66   Pulse 78   Ht 5\' 6"  (1.676 m)   Wt 139 lb (63 kg)   SpO2 99%   BMI 22.44 kg/m   Body mass index is 22.44 kg/m.  Advanced Directives 05/22/2018 05/05/2016  Does Patient Have a Medical Advance Directive? Yes Yes  Type of Paramedic of [place];Living will -  Copy of [place] in Chart? No - copy requested No - copy requested    Tobacco Social History   Tobacco Use  Smoking Status Never Smoker  Smokeless Tobacco Never Used     Counseling given: Not Answered   Clinical Intake:    Physical Exam  Constitutional: She is oriented to person, place, and time. She appears well-developed and well-nourished.  HENT:  Head: Normocephalic and atraumatic.  Right Ear: External ear normal.  Left Ear: External ear normal.  Nose: Nose normal.  Mouth/Throat: Oropharynx is clear and moist.  TMs and canals are clear.   Eyes: Pupils are equal, round, and reactive to light. Conjunctivae and EOM are normal.  Neck: Neck supple. No thyromegaly present.    Cardiovascular: Normal rate, regular rhythm and normal heart sounds.  Pulmonary/Chest: Effort normal and breath sounds normal. She has no wheezes.  No significant discoloration or erythema of the nipple but she does have what looks like dried blood right at the tip of the nipple.  It would look like a little bloody discharge on her bra.    Lymphadenopathy:    She has no cervical adenopathy.  Neurological: She is alert and oriented to person, place, and time.  Skin: Skin is warm and dry.  Psychiatric: She has a normal mood and affect.                      Past Medical History:  Diagnosis Date  . 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 TUBAL LIGATION  1980   Family History  Problem Relation Age of Onset  . Hypertension Mother   . Heart disease Mother        CHF  . COPD Mother   . Arthritis Mother   . Alcohol abuse Father   . Alcohol abuse Son   . Diabetes Son        type 2   Social History   Socioeconomic History  . Marital status: Married    Spouse name: Kerry Dory  . Number of children: Not on file  .  Years of education: Not on file  . Highest education level: Not on file  Occupational History  . Not on file  Social Needs  . Financial resource strain: Not on file  . Food insecurity:    Worry: Not on file    Inability: Not on file  . Transportation needs:    Medical: Not on file    Non-medical: Not on file  Tobacco Use  . Smoking status: Never Smoker  . Smokeless tobacco: Never Used  Substance and Sexual Activity  . Alcohol use: No  . Drug use: Never  . Sexual activity: Not on file    Comment: Owns cleaning business, 3 sons, regularly exercises, finished HS.  Lifestyle  . Physical activity:    Days per week: Not on file    Minutes per session: Not on file  . Stress: Not on file  Relationships  . Social connections:    Talks on phone: Not on file    Gets together: Not on  file    Attends religious service: Not on file    Active member of club or organization: Not on file    Attends meetings of clubs or organizations: Not on file    Relationship status: Not on file  Other Topics Concern  . Not on file  Social History Narrative  . Not on file    Outpatient Encounter Medications as of 05/22/2018  Medication Sig  . [DISCONTINUED] doxycycline (VIBRA-TABS) 100 MG tablet Take 1 tablet (100 mg total) by mouth 2 (two) times daily.   No facility-administered encounter medications on file as of 05/22/2018.     Activities of Daily Living In your present state of health, do you have any difficulty performing the following activities: 05/22/2018  Hearing? N  Vision? N  Difficulty concentrating or making decisions? N  Walking or climbing stairs? N  Dressing or bathing? N  Doing errands, shopping? N  Some recent data might be hidden    Patient Care Team: Hali Marry, MD as PCP - General (Family Medicine)    Assessment:   This is a routine wellness examination for [place].  Exercise Activities and Dietary recommendations Current Exercise Habits: Home exercise routine, Frequency (Times/Week): 7, Intensity: Moderate, Exercise limited by: None identified  Goals   None     Fall Risk Fall Risk  05/22/2018 04/20/2018 04/12/2017 04/02/2016 07/15/2014  Falls in the past year? Yes No No No No  Number falls in past yr: 1 - - - -  Injury with Fall? No - - - -  Risk for fall due to : Other (Comment) - - - -  Risk for fall due to: Comment uneven surface - - - -  Follow up Falls prevention discussed - - - -    Depression Screen PHQ 2/9 Scores 05/22/2018 04/20/2018 04/12/2017 04/12/2017  PHQ - 2 Score 0 0 0 0     Cognitive Function     6CIT Screen 05/22/2018  What Year? 0 points  What month? 0 points  What time? 0 points  Count back from 20 0 points  Months in reverse 0 points  Repeat phrase 0 points  Total Score 0    Immunization History    Administered Date(s) Administered  . Pneumococcal Conjugate-13 07/15/2014  . Tdap 05/09/2012    Qualifies for Shingles Vaccine?Yes  Screening Tests Health Maintenance  Topic Date Due  . PNA vac Low Risk Adult (2 of 2 - PPSV23) 07/16/2015  . INFLUENZA VACCINE  01/09/2019 (Originally  04/27/2018)  . TETANUS/TDAP  05/09/2022  . DEXA SCAN  Completed    Cancer Screenings: Lung: Low Dose CT Chest recommended if Age 25-80 years, 30 pack-year currently smoking OR have quit w/in 15years. Patient does not qualify. Breast:  Up to date on Mammogram? No   Up to date of Bone Density/Dexa? No Colorectal: declined  Additional Screenings: : Hepatitis C Screening:      Plan:   Medicare Wellness Exam   I have personally reviewed and noted the following in the patient's chart:   . Medical and social history . Use of alcohol, tobacco or illicit drugs  . Current medications and supplements -he has recently started several supplements to help her breast tissue. . Functional ability and status . Nutritional status . Physical activity . Advanced directives . List of other physicians - no other . Hospitalizations, surgeries, and ER visits in previous 12 months . Vitals . Screenings to include cognitive, depression, and falls - she did have a recent fall of a ladder.   . Referrals and appointments -[facility]url for breast MRI.  She is not willing to do mammogram but is willing to consider an MRI.  In addition, I have reviewed and discussed with patient certain preventive protocols, quality metrics, and best practice recommendations. A written personalized care plan for preventive services as well as general preventive health recommendations were provided to patient.     Beatrice Lecher, MD  05/23/2018

## 2018-05-22 NOTE — Patient Instructions (Signed)

## 2018-05-22 NOTE — Progress Notes (Addendum)
   Subjective:    Patient ID: Crystal Ward, female    DOB: 1940-11-01, 77 y.o.   MRN: [id number]  HPI  77 year old female who came in approximately 4 weeks ago with some erythema around the left breast.  We discussed working her up for breast cancer since she did have a prior history which she never formally treated with conservative therapy.  She treated with alternative therapies and diet.  I did prescribe an antibiotic in case there was some cellulitis or mastitis did she says the erythema did clean up and she significantly cleaned up her diet.  She is sending her urine off to a provider in the [place] to see if she has breast cancer or not.  She is interested in some imaging but she is not at all interested interested in treatment that would include surgery, chemotherapy, or radiation.  So wanted to let me know about a fall that happened about 2 weeks ago.  She was standing on top of a ladder putting a suitcase up in her new closet when she feels like the ladder became unsteady and she fell backwards.  She landed on her posterior right hip and feels like something twisted.  It is getting better but it is still sore.  She is able to walk though initially she had some difficulty.  Is painful to sit particularly if she leans to the right.  She says she just got finished painting her entire inside of her house and outside of her house.  Review of Systems     Objective:   Physical Exam  Constitutional: She appears well-developed and well-nourished.  Musculoskeletal:  Does have some tenderness over that right posterior upper thigh.  I am able to palpate a lump or there is likely hematoma in the muscle just distal buttock area.  Normal flexion and extension of the hip.  Nontender over the greater trochanter.    See pic of breast exam.      Assessment & Plan:  Left breast mass and nipple discharge-very concerning for breast cancer.  She is okay with some imaging to get a get a better idea of  what is going on.  But she believes in alternative treatments and dietary changes and is not interested in any type of conservative therapy at this point in her life.  Right posterior upper thigh pain secondary to fall off of a ladder.  She does have some palpable tenderness as well as a knot likely a hematoma.  We discussed stretches, massage and continuing to ice as needed.  She is able to ambulate without difficulty so I think fracture is highly unlikely.  No tenderness over the greater trochanter.

## 2018-05-23 LAB — CBC WITH DIFFERENTIAL/PLATELET
BASOS PCT: 0.9 %
Basophils Absolute: 52 cells/uL (ref 0–200)
EOS ABS: 110 {cells}/uL (ref 15–500)
Eosinophils Relative: 1.9 %
HEMATOCRIT: 38.5 % (ref 35.0–45.0)
HEMOGLOBIN: 13 g/dL (ref 11.7–15.5)
LYMPHS ABS: 1143 {cells}/uL (ref 850–3900)
MCH: 29.6 pg (ref 27.0–33.0)
MCHC: 33.8 g/dL (ref 32.0–36.0)
MCV: 87.7 fL (ref 80.0–100.0)
MPV: 10.8 fL (ref 7.5–12.5)
Monocytes Relative: 9.4 %
NEUTROS ABS: 3950 {cells}/uL (ref 1500–7800)
Neutrophils Relative %: 68.1 %
Platelets: 202 10*3/uL (ref 140–400)
RBC: 4.39 10*6/uL (ref 3.80–5.10)
RDW: 12.5 % (ref 11.0–15.0)
Total Lymphocyte: 19.7 %
WBC: 5.8 10*3/uL (ref 3.8–10.8)
WBCMIX: 545 {cells}/uL (ref 200–950)

## 2018-05-23 LAB — COMPLETE METABOLIC PANEL WITH GFR
AG Ratio: 2 (calc) (ref 1.0–2.5)
ALBUMIN MSPROF: 4.5 g/dL (ref 3.6–5.1)
ALKALINE PHOSPHATASE (APISO): 47 U/L (ref 33–130)
ALT: 16 U/L (ref 6–29)
AST: 22 U/L (ref 10–35)
BUN/Creatinine Ratio: 17 (calc) (ref 6–22)
BUN: 17 mg/dL (ref 7–25)
CALCIUM: 10 mg/dL (ref 8.6–10.4)
CO2: 28 mmol/L (ref 20–32)
CREATININE: 0.98 mg/dL — AB (ref 0.60–0.93)
Chloride: 103 mmol/L (ref 98–110)
GFR, EST NON AFRICAN AMERICAN: 56 mL/min/{1.73_m2} — AB (ref >=60)
GFR, Est African American: 65 mL/min/{1.73_m2} (ref >=60)
GLUCOSE: 93 mg/dL (ref 65–99)
Globulin: 2.2 g/dL (calc) (ref 1.9–3.7)
Potassium: 4.6 mmol/L (ref 3.5–5.3)
Sodium: 137 mmol/L (ref 135–146)
Total Bilirubin: 0.6 mg/dL (ref 0.2–1.2)
Total Protein: 6.7 g/dL (ref 6.1–8.1)

## 2018-05-23 LAB — LIPID PANEL
CHOL/HDL RATIO: 2.4 (calc) (ref <5.0)
CHOLESTEROL: 141 mg/dL (ref <200)
HDL: 58 mg/dL (ref >50)
LDL CHOLESTEROL (CALC): 66 mg/dL
NON-HDL CHOLESTEROL (CALC): 83 mg/dL (ref <130)
Triglycerides: 88 mg/dL (ref <150)

## 2018-05-23 LAB — TSH: TSH: 1.28 mIU/L (ref 0.40–4.50)

## 2018-05-25 ENCOUNTER — Telehealth: Payer: Self-pay | Admitting: Family Medicine

## 2018-05-25 NOTE — Telephone Encounter (Signed)
I called [facility] to see about scheduling breast MRI for patient and was told that per the radiologist the patient had to have a mammogram done due to she was having nipple discharge. I then called [facility] to see if they would do the MRI because patient had history of breast cancer and was refusing a mammogram. They called me back and stated that the radiologist Dr. Melina Copa was aware of the patient and and that [facility]man was the one who did her mammogram and MRI in 2010 but never truly diagnosed patient with breast cancer because she refused to have a biopsy done. Dr. Melina Copa also stated he did not want to do the MRI because the patient refused any treatment that would be needed other than the MRI. I called the patient to let her know we would need a mammogram before the MRI could be done and she said absolutely not just to forget doing the test at all. - CF

## 2018-06-05 ENCOUNTER — Other Ambulatory Visit: Payer: Self-pay

## 2018-06-05 DIAGNOSIS — N63 Unspecified lump in unspecified breast: Secondary | ICD-10-CM

## 2018-06-05 DIAGNOSIS — N6320 Unspecified lump in the left breast, unspecified quadrant: Secondary | ICD-10-CM

## 2018-06-05 DIAGNOSIS — N6452 Nipple discharge: Secondary | ICD-10-CM

## 2018-10-30 ENCOUNTER — Telehealth: Payer: Self-pay | Admitting: Family Medicine

## 2018-10-30 NOTE — Telephone Encounter (Signed)
Crystal Ward, Crystal Ward left a voicemail at 11:30 for you Today and requesting you call her back as soon as you have a chance. Thanks

## 2018-10-30 NOTE — Telephone Encounter (Signed)
Spoke w/pt's wife and gave results and recommendations.Marland KitchenMarland KitchenMarland KitchenElouise Ward, [place]

## 2019-06-12 DIAGNOSIS — H2513 Age-related nuclear cataract, bilateral: Secondary | ICD-10-CM | POA: Diagnosis not present

## 2019-06-12 DIAGNOSIS — H524 Presbyopia: Secondary | ICD-10-CM | POA: Diagnosis not present

## 2019-07-10 ENCOUNTER — Encounter: Payer: Medicare HMO | Admitting: Family Medicine

## 2019-07-17 ENCOUNTER — Other Ambulatory Visit: Payer: Self-pay

## 2019-07-17 ENCOUNTER — Encounter: Payer: Self-pay | Admitting: Family Medicine

## 2019-07-17 ENCOUNTER — Ambulatory Visit (INDEPENDENT_AMBULATORY_CARE_PROVIDER_SITE_OTHER): Payer: Medicare HMO | Admitting: Family Medicine

## 2019-07-17 VITALS — BP 129/55 | HR 68 | Ht 66.0 in | Wt 154.0 lb

## 2019-07-17 DIAGNOSIS — M8580 Other specified disorders of bone density and structure, unspecified site: Secondary | ICD-10-CM | POA: Diagnosis not present

## 2019-07-17 DIAGNOSIS — Z0000 Encounter for general adult medical examination without abnormal findings: Secondary | ICD-10-CM

## 2019-07-17 DIAGNOSIS — Z13220 Encounter for screening for lipoid disorders: Secondary | ICD-10-CM | POA: Diagnosis not present

## 2019-07-17 DIAGNOSIS — N1831 Chronic kidney disease, stage 3a: Secondary | ICD-10-CM

## 2019-07-17 DIAGNOSIS — E781 Pure hyperglyceridemia: Secondary | ICD-10-CM | POA: Diagnosis not present

## 2019-07-17 NOTE — Patient Instructions (Signed)
Health Maintenance After Age 77 After age 77, you are at a higher risk for certain long-term diseases and infections as well as injuries from falls. Falls are a major cause of broken bones and head injuries in people who are older than age 77. Getting regular preventive care can help to keep you healthy and well. Preventive care includes getting regular testing and making lifestyle changes as recommended by your health care provider. Talk with your health care provider about:  Which screenings and tests you should have. A screening is a test that checks for a disease when you have no symptoms.  A diet and exercise plan that is right for you. What should I know about screenings and tests to prevent falls? Screening and testing are the best ways to find a health problem early. Early diagnosis and treatment give you the best chance of managing medical conditions that are common after age 77. Certain conditions and lifestyle choices may make you more likely to have a fall. Your health care provider may recommend:  Regular vision checks. Poor vision and conditions such as cataracts can make you more likely to have a fall. If you wear glasses, make sure to get your prescription updated if your vision changes.  Medicine review. Work with your health care provider to regularly review all of the medicines you are taking, including over-the-counter medicines. Ask your health care provider about any side effects that may make you more likely to have a fall. Tell your health care provider if any medicines that you take make you feel dizzy or sleepy.  Osteoporosis screening. Osteoporosis is a condition that causes the bones to get weaker. This can make the bones weak and cause them to break more easily.  Blood pressure screening. Blood pressure changes and medicines to control blood pressure can make you feel dizzy.  Strength and balance checks. Your health care provider may recommend certain tests to check your  strength and balance while standing, walking, or changing positions.  Foot health exam. Foot pain and numbness, as well as not wearing proper footwear, can make you more likely to have a fall.  Depression screening. You may be more likely to have a fall if you have a fear of falling, feel emotionally low, or feel unable to do activities that you used to do.  Alcohol use screening. Using too much alcohol can affect your balance and may make you more likely to have a fall. What actions can I take to lower my risk of falls? General instructions  Talk with your health care provider about your risks for falling. Tell your health care provider if: ? You fall. Be sure to tell your health care provider about all falls, even ones that seem minor. ? You feel dizzy, sleepy, or off-balance.  Take over-the-counter and prescription medicines only as told by your health care provider. These include any supplements.  Eat a healthy diet and maintain a healthy weight. A healthy diet includes low-fat dairy products, low-fat (lean) meats, and fiber from whole grains, beans, and lots of fruits and vegetables. Home safety  Remove any tripping hazards, such as rugs, cords, and clutter.  Install safety equipment such as grab bars in bathrooms and safety rails on stairs.  Keep rooms and walkways well-lit. Activity   Follow a regular exercise program to stay fit. This will help you maintain your balance. Ask your health care provider what types of exercise are appropriate for you.  If you need a cane or   walker, use it as recommended by your health care provider.  Wear supportive shoes that have nonskid soles. Lifestyle  Do not drink alcohol if your health care provider tells you not to drink.  If you drink alcohol, limit how much you have: ? 0-1 drink a day for women. ? 0-2 drinks a day for men.  Be aware of how much alcohol is in your drink. In the [place] one drink equals one typical bottle of beer (12  oz), one-half glass of wine (5 oz), or one shot of hard liquor (1 oz).  Do not use any products that contain nicotine or tobacco, such as cigarettes and e-cigarettes. If you need help quitting, ask your health care provider. Summary  Having a healthy lifestyle and getting preventive care can help to protect your health and wellness after age 77.  Screening and testing are the best way to find a health problem early and help you avoid having a fall. Early diagnosis and treatment give you the best chance for managing medical conditions that are more common for people who are older than age 77.  Falls are a major cause of broken bones and head injuries in people who are older than age 77. Take precautions to prevent a fall at home.  Work with your health care provider to learn what changes you can make to improve your health and wellness and to prevent falls. This information is not intended to replace advice given to you by your health care provider. Make sure you discuss any questions you have with your health care provider. Document Released: 07/27/2017 Document Revised: 01/04/2019 Document Reviewed: 07/27/2017 Elsevier Patient Education  2020 [employer]  

## 2019-07-17 NOTE — Progress Notes (Signed)
Established Patient Office Visit  Subjective:  Patient ID: Crystal Ward, female    DOB: 09/28/1940  Age: 78 y.o. MRN: [id number]  CC:  Chief Complaint  Patient presents with  . Annual Exam    HPI Crystal Ward presents for wellness exam.  She is doing well overall.  Her husband passed away about 9 months ago.  She says she really struggled the first 2 months but feels like she is starting to spend time with friends and has tried to keep herself busy.  She has had much less tearful episodes.  More recently she says she decided to really get good control of her weight she.  She feels like she gained a lot of weight after he passed away doing a lot of baking.  She is actually planning on traveling to [place] where 2 of her sons live for the holidays and then going to see her third son in [place] in February.  She is actually really excited about this.  She does decline the pneumonia vaccine as well as the flu vaccine today.  Past Medical History:  Diagnosis Date  . 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 TUBAL LIGATION  1980    Family History  Problem Relation Age of Onset  . Hypertension Mother   . Heart disease Mother        CHF  . COPD Mother   . Arthritis Mother   . Alcohol abuse Father   . Alcohol abuse Son   . Diabetes Son        type 2    Social History   Socioeconomic History  . Marital status: Married    Spouse name: Kerry Dory  . Number of children: Not on file  . Years of education: Not on file  . Highest education level: Not on file  Occupational History  . Not on file  Social Needs  . Financial resource strain: Not on file  . Food insecurity    Worry: Not on file    Inability: Not on file  . Transportation needs    Medical: Not on file    Non-medical: Not on file  Tobacco Use  . Smoking status: Never Smoker  . Smokeless tobacco: Never Used  Substance and Sexual Activity  .  Alcohol use: No  . Drug use: Never  . Sexual activity: Not on file    Comment: Owns cleaning business, 3 sons, regularly exercises, finished HS.  Lifestyle  . Physical activity    Days per week: Not on file    Minutes per session: Not on file  . Stress: Not on file  Relationships  . Social Herbalist on phone: Not on file    Gets together: Not on file    Attends religious service: Not on file    Active member of club or organization: Not on file    Attends meetings of clubs or organizations: Not on file    Relationship status: Not on file  . Intimate partner violence    Fear of current or ex partner: Not on file    Emotionally abused: Not on file    Physically abused: Not on file    Forced sexual activity: Not on file  Other Topics Concern  . Not on file  Social History Narrative  . Not on file    No outpatient medications prior to visit.   No facility-administered  medications prior to visit.     No Known Allergies  ROS Review of Systems    Objective:    Physical Exam  Constitutional: She is oriented to person, place, and time. She appears well-developed and well-nourished.  HENT:  Head: Normocephalic and atraumatic.  Right Ear: External ear normal.  Left Ear: External ear normal.  Nose: Nose normal.  Mouth/Throat: Oropharynx is clear and moist.  TMs and canals are clear.   Eyes: Pupils are equal, round, and reactive to light. Conjunctivae and EOM are normal.  Neck: Neck supple. No thyromegaly present.  Cardiovascular: Normal rate, regular rhythm and normal heart sounds.  Pulmonary/Chest: Effort normal and breath sounds normal. She has no wheezes.  Abdominal: Soft. Bowel sounds are normal. She exhibits no distension and no mass. There is no abdominal tenderness. There is no rebound and no guarding.  Lymphadenopathy:    She has no cervical adenopathy.  Neurological: She is alert and oriented to person, place, and time.  Skin: Skin is warm and dry.   Psychiatric: She has a normal mood and affect. Her behavior is normal.    There were no vitals taken for this visit. Wt Readings from Last 3 Encounters:  05/22/18 139 lb (63 kg)  04/20/18 146 lb (66.2 kg)  04/12/17 153 lb (69.4 kg)     There are no preventive care reminders to display for this patient.  There are no preventive care reminders to display for this patient.  Lab Results  Component Value Date   TSH 1.28 05/22/2018   Lab Results  Component Value Date   WBC 5.8 05/22/2018   HGB 13.0 05/22/2018   HCT 38.5 05/22/2018   MCV 87.7 05/22/2018   PLT 202 05/22/2018   Lab Results  Component Value Date   NA 137 05/22/2018   K 4.6 05/22/2018   CO2 28 05/22/2018   GLUCOSE 93 05/22/2018   BUN 17 05/22/2018   CREATININE 0.98 (H) 05/22/2018   BILITOT 0.6 05/22/2018   ALKPHOS 59 04/02/2016   AST 22 05/22/2018   ALT 16 05/22/2018   PROT 6.7 05/22/2018   ALBUMIN 4.4 04/02/2016   CALCIUM 10.0 05/22/2018   Lab Results  Component Value Date   CHOL 141 05/22/2018   Lab Results  Component Value Date   HDL 58 05/22/2018   Lab Results  Component Value Date   LDLCALC 66 05/22/2018   Lab Results  Component Value Date   TRIG 88 05/22/2018   Lab Results  Component Value Date   CHOLHDL 2.4 05/22/2018   No results found for: HGBA1C    Assessment & Plan:   Problem List Items Addressed This Visit      Musculoskeletal and Integument   Osteopenia   Relevant Orders   DG Bone Density     Genitourinary   CKD (chronic kidney disease) stage 3, GFR 30-59 ml/min   Relevant Orders   COMPLETE METABOLIC PANEL WITH GFR   Lipid panel   Urine Microalbumin w/creat. ratio    Other Visit Diagnoses    Wellness examination    -  Primary   Screening, lipid       Relevant Orders   Lipid panel    Keep up a regular exercise program and make sure you are eating a healthy diet Try to eat 4 servings of dairy a day, or if you are lactose intolerant take a calcium with vitamin  D daily.  Your vaccines are up to date.  She is due for  bone density as well as up-to-date blood work. Declines pneumonia vaccine and flu vaccine today.   No orders of the defined types were placed in this encounter.   Follow-up: Return in about 1 year (around 07/16/2020) for Wellness Exam.    Beatrice Lecher, MD

## 2019-07-18 LAB — COMPLETE METABOLIC PANEL WITH GFR
AG Ratio: 2.1 (calc) (ref 1.0–2.5)
ALT: 16 U/L (ref 6–29)
AST: 20 U/L (ref 10–35)
Albumin: 4.4 g/dL (ref 3.6–5.1)
Alkaline phosphatase (APISO): 46 U/L (ref 37–153)
BUN/Creatinine Ratio: 10 (calc) (ref 6–22)
BUN: 11 mg/dL (ref 7–25)
CO2: 27 mmol/L (ref 20–32)
Calcium: 10 mg/dL (ref 8.6–10.4)
Chloride: 102 mmol/L (ref 98–110)
Creat: 1.07 mg/dL — ABNORMAL HIGH (ref 0.60–0.93)
GFR, Est African American: 58 mL/min/{1.73_m2} — ABNORMAL LOW (ref >=60)
GFR, Est Non African American: 50 mL/min/{1.73_m2} — ABNORMAL LOW (ref >=60)
Globulin: 2.1 g/dL (calc) (ref 1.9–3.7)
Glucose, Bld: 96 mg/dL (ref 65–99)
Potassium: 4.1 mmol/L (ref 3.5–5.3)
Sodium: 138 mmol/L (ref 135–146)
Total Bilirubin: 0.5 mg/dL (ref 0.2–1.2)
Total Protein: 6.5 g/dL (ref 6.1–8.1)

## 2019-07-18 LAB — LIPID PANEL
Cholesterol: 165 mg/dL (ref <200)
HDL: 64 mg/dL (ref >=50)
LDL Cholesterol (Calc): 83 mg/dL (calc)
Non-HDL Cholesterol (Calc): 101 mg/dL (calc) (ref <130)
Total CHOL/HDL Ratio: 2.6 (calc) (ref <5.0)
Triglycerides: 89 mg/dL (ref <150)

## 2019-07-25 ENCOUNTER — Ambulatory Visit (INDEPENDENT_AMBULATORY_CARE_PROVIDER_SITE_OTHER): Payer: Medicare HMO

## 2019-07-25 ENCOUNTER — Other Ambulatory Visit: Payer: Self-pay

## 2019-07-25 DIAGNOSIS — M8580 Other specified disorders of bone density and structure, unspecified site: Secondary | ICD-10-CM

## 2019-07-25 DIAGNOSIS — M8589 Other specified disorders of bone density and structure, multiple sites: Secondary | ICD-10-CM | POA: Diagnosis not present

## 2019-07-25 DIAGNOSIS — Z780 Asymptomatic menopausal state: Secondary | ICD-10-CM | POA: Diagnosis not present

## 2019-09-27 DIAGNOSIS — R1031 Right lower quadrant pain: Secondary | ICD-10-CM | POA: Diagnosis not present

## 2019-09-27 DIAGNOSIS — Z90710 Acquired absence of both cervix and uterus: Secondary | ICD-10-CM | POA: Diagnosis not present

## 2019-09-27 DIAGNOSIS — R1032 Left lower quadrant pain: Secondary | ICD-10-CM | POA: Diagnosis not present

## 2019-09-27 DIAGNOSIS — R10814 Left lower quadrant abdominal tenderness: Secondary | ICD-10-CM | POA: Diagnosis not present

## 2019-09-27 DIAGNOSIS — R10813 Right lower quadrant abdominal tenderness: Secondary | ICD-10-CM | POA: Diagnosis not present

## 2019-09-27 DIAGNOSIS — R509 Fever, unspecified: Secondary | ICD-10-CM | POA: Diagnosis not present

## 2019-09-27 DIAGNOSIS — R197 Diarrhea, unspecified: Secondary | ICD-10-CM | POA: Diagnosis not present

## 2019-09-27 DIAGNOSIS — R1033 Periumbilical pain: Secondary | ICD-10-CM | POA: Diagnosis not present

## 2019-09-28 DIAGNOSIS — R197 Diarrhea, unspecified: Secondary | ICD-10-CM | POA: Diagnosis not present

## 2020-07-28 ENCOUNTER — Other Ambulatory Visit: Payer: Self-pay

## 2020-07-28 ENCOUNTER — Encounter: Payer: Self-pay | Admitting: Family Medicine

## 2020-07-28 ENCOUNTER — Ambulatory Visit (INDEPENDENT_AMBULATORY_CARE_PROVIDER_SITE_OTHER): Payer: Medicare HMO | Admitting: Family Medicine

## 2020-07-28 VITALS — BP 127/56 | HR 68 | Ht 66.0 in | Wt 145.0 lb

## 2020-07-28 DIAGNOSIS — L609 Nail disorder, unspecified: Secondary | ICD-10-CM | POA: Diagnosis not present

## 2020-07-28 DIAGNOSIS — N1831 Chronic kidney disease, stage 3a: Secondary | ICD-10-CM

## 2020-07-28 DIAGNOSIS — M8580 Other specified disorders of bone density and structure, unspecified site: Secondary | ICD-10-CM | POA: Diagnosis not present

## 2020-07-28 DIAGNOSIS — Z0000 Encounter for general adult medical examination without abnormal findings: Secondary | ICD-10-CM | POA: Diagnosis not present

## 2020-07-28 NOTE — Progress Notes (Signed)
Subjective:     Crystal Ward is a 79 y.o. female and is here for a comprehensive physical exam. The patient reports no problems.  She is doing really well and feels great.  She will actually be moving back to [place] in about 2 weeks to be [place] her son and family and grandchildren.  She is excited about this move.  Social History   Socioeconomic History  . Marital status: Married    Spouse name: Kerry Dory  . Number of children: Not on file  . Years of education: Not on file  . Highest education level: Not on file  Occupational History  . Not on file  Tobacco Use  . Smoking status: Never Smoker  . Smokeless tobacco: Never Used  Substance and Sexual Activity  . Alcohol use: No  . Drug use: Never  . Sexual activity: Not on file    Comment: Owns cleaning business, 3 sons, regularly exercises, finished HS.  Other Topics Concern  . Not on file  Social History Narrative  . Not on file   Social Determinants of Health   Financial Resource Strain:   . Difficulty of Paying Living Expenses: Not on file  Food Insecurity:   . Worried About Charity fundraiser in the Last Year: Not on file  . Ran Out of Food in the Last Year: Not on file  Transportation Needs:   . Lack of Transportation (Medical): Not on file  . Lack of Transportation (Non-Medical): Not on file  Physical Activity:   . Days of Exercise per Week: Not on file  . Minutes of Exercise per Session: Not on file  Stress:   . Feeling of Stress : Not on file  Social Connections:   . Frequency of Communication with Friends and Family: Not on file  . Frequency of Social Gatherings with Friends and Family: Not on file  . Attends Religious Services: Not on file  . Active Member of Clubs or Organizations: Not on file  . Attends Archivist Meetings: Not on file  . Marital Status: Not on file  Intimate Partner Violence:   . Fear of Current or Ex-Partner: Not on file  . Emotionally Abused: Not on file  . Physically  Abused: Not on file  . Sexually Abused: Not on file   Health Maintenance  Topic Date Due  . COVID-19 Vaccine (1) 08/13/2020 (Originally 11/04/1952)  . INFLUENZA VACCINE  12/25/2020 (Originally 04/27/2020)  . Hepatitis C Screening  07/28/2021 (Originally 1941/06/02)  . PNA vac Low Risk Adult (2 of 2 - PPSV23) 07/28/2021 (Originally 07/16/2015)  . TETANUS/TDAP  05/09/2022  . DEXA SCAN  Completed    The following portions of the patient's history were reviewed and updated as appropriate: allergies, current medications, past family history, past medical history, past social history, past surgical history and problem list.  Review of Systems A comprehensive review of systems was negative.   Objective:    BP (!) 127/56   Pulse 68   Ht 5\' 6"  (1.676 m)   Wt 145 lb (65.8 kg)   SpO2 98%   BMI 23.40 kg/m  General appearance: alert, cooperative and appears stated age Head: Normocephalic, without obvious abnormality, atraumatic Eyes: conj clear, EOMI, PEERLA Ears: normal TM's and external ear canals both ears Nose: Nares normal. Septum midline. Mucosa normal. No drainage or sinus tenderness. Throat: lips, mucosa, and tongue normal; teeth and gums normal Neck: no adenopathy, no carotid bruit, no JVD, supple, symmetrical, trachea midline and thyroid  not enlarged, symmetric, no tenderness/mass/nodules Back: symmetric, no curvature. ROM normal. No CVA tenderness. Lungs: clear to auscultation bilaterally Breasts: normal appearance, no masses or tenderness Heart: regular rate and rhythm, S1, S2 normal, no murmur, click, rub or gallop Abdomen: soft, non-tender; bowel sounds normal; no masses,  no organomegaly Extremities: extremities normal, atraumatic, no cyanosis or edema Pulses: 2+ and symmetric Skin: Skin color, texture, turgor normal. No rashes or lesions Lymph nodes: Cervical, supraclavicular, and axillary nodes normal. Neurologic: Alert and oriented X 3, normal strength and tone. Normal  symmetric reflexes. Normal coordination and gait    Assessment:    Healthy female exam.     Plan:     See After Visit Summary for Counseling Recommendations   complete physical examination Keep up a regular exercise program and make sure you are eating a healthy diet Try to eat 4 servings of dairy a day, or if you are lactose intolerant take a calcium with vitamin D daily.  Your vaccines are up to date.  She declines pneumonia vaccination, flu vaccination, mammogram, colon cancer screening.  CKD 3-recheck renal function  Osteopenia-due for bone density.

## 2020-07-28 NOTE — Assessment & Plan Note (Signed)
Due for bone density test.  We will try to get that scheduled before she moves to [place] in 2 weeks.

## 2020-07-28 NOTE — Assessment & Plan Note (Signed)
For updated labs to check renal function.  Really needs to be checked every 6 months.

## 2020-07-29 DIAGNOSIS — Z0000 Encounter for general adult medical examination without abnormal findings: Secondary | ICD-10-CM | POA: Diagnosis not present

## 2020-07-29 DIAGNOSIS — L609 Nail disorder, unspecified: Secondary | ICD-10-CM | POA: Diagnosis not present

## 2020-07-29 DIAGNOSIS — M8580 Other specified disorders of bone density and structure, unspecified site: Secondary | ICD-10-CM | POA: Diagnosis not present

## 2020-07-29 DIAGNOSIS — N1831 Chronic kidney disease, stage 3a: Secondary | ICD-10-CM | POA: Diagnosis not present

## 2020-07-30 LAB — CBC
HCT: 39.6 % (ref 35.0–45.0)
Hemoglobin: 12.6 g/dL (ref 11.7–15.5)
MCH: 28.5 pg (ref 27.0–33.0)
MCHC: 31.8 g/dL — ABNORMAL LOW (ref 32.0–36.0)
MCV: 89.6 fL (ref 80.0–100.0)
MPV: 11.3 fL (ref 7.5–12.5)
Platelets: 197 10*3/uL (ref 140–400)
RBC: 4.42 10*6/uL (ref 3.80–5.10)
RDW: 12.6 % (ref 11.0–15.0)
WBC: 4.4 10*3/uL (ref 3.8–10.8)

## 2020-07-30 LAB — COMPLETE METABOLIC PANEL WITH GFR
AG Ratio: 2.1 (calc) (ref 1.0–2.5)
ALT: 12 U/L (ref 6–29)
AST: 12 U/L (ref 10–35)
Albumin: 3.9 g/dL (ref 3.6–5.1)
Alkaline phosphatase (APISO): 42 U/L (ref 37–153)
BUN/Creatinine Ratio: 17 (calc) (ref 6–22)
BUN: 17 mg/dL (ref 7–25)
CO2: 28 mmol/L (ref 20–32)
Calcium: 9.3 mg/dL (ref 8.6–10.4)
Chloride: 106 mmol/L (ref 98–110)
Creat: 1.02 mg/dL — ABNORMAL HIGH (ref 0.60–0.93)
GFR, Est African American: 61 mL/min/{1.73_m2} (ref >=60)
GFR, Est Non African American: 53 mL/min/{1.73_m2} — ABNORMAL LOW (ref >=60)
Globulin: 1.9 g/dL (calc) (ref 1.9–3.7)
Glucose, Bld: 84 mg/dL (ref 65–99)
Potassium: 4.2 mmol/L (ref 3.5–5.3)
Sodium: 140 mmol/L (ref 135–146)
Total Bilirubin: 0.7 mg/dL (ref 0.2–1.2)
Total Protein: 5.8 g/dL — ABNORMAL LOW (ref 6.1–8.1)

## 2020-07-30 LAB — MICROALBUMIN / CREATININE URINE RATIO
Creatinine, Urine: 98 mg/dL (ref 20–275)
Microalb Creat Ratio: 19 mcg/mg creat (ref <30)
Microalb, Ur: 1.9 mg/dL
